# Patient Record
Sex: Female | Born: 1965
Health system: Southern US, Community
[De-identification: ages and names within clinical notes are randomized; demographics above are authoritative.]

## PROBLEM LIST (undated history)

## (undated) DIAGNOSIS — F419 Anxiety disorder, unspecified: Secondary | ICD-10-CM

## (undated) DIAGNOSIS — F32A Depression, unspecified: Secondary | ICD-10-CM

## (undated) DIAGNOSIS — I10 Essential (primary) hypertension: Secondary | ICD-10-CM

## (undated) HISTORY — PX: HERNIA REPAIR: SHX51

## (undated) HISTORY — PX: APPENDECTOMY: SHX54

## (undated) HISTORY — PX: ABDOMINAL HYSTERECTOMY: SHX81

---

## 2001-09-30 ENCOUNTER — Other Ambulatory Visit: Admission: RE | Admit: 2001-09-30 | Discharge: 2001-09-30 | Payer: Self-pay | Admitting: Family Medicine

## 2003-05-01 ENCOUNTER — Other Ambulatory Visit: Admission: RE | Admit: 2003-05-01 | Discharge: 2003-05-01 | Payer: Self-pay | Admitting: Obstetrics & Gynecology

## 2003-11-24 ENCOUNTER — Observation Stay (HOSPITAL_COMMUNITY): Admission: RE | Admit: 2003-11-24 | Discharge: 2003-11-25 | Payer: Self-pay | Admitting: Obstetrics & Gynecology

## 2010-02-06 ENCOUNTER — Encounter: Payer: Self-pay | Admitting: Family Medicine

## 2012-05-21 ENCOUNTER — Other Ambulatory Visit: Payer: Self-pay

## 2012-05-21 MED ORDER — ALPRAZOLAM 0.5 MG PO TABS: 0.5000 mg | ORAL_TABLET | Freq: Every evening | ORAL | Status: DC | PRN

## 2012-05-21 NOTE — Telephone Encounter (Signed)
Please call in rx for xanax 

## 2012-05-21 NOTE — Telephone Encounter (Signed)
Last seen 5/13  If approved call in and have nurse notify patient

## 2012-05-22 NOTE — Telephone Encounter (Signed)
[facility]

## 2012-05-23 ENCOUNTER — Other Ambulatory Visit: Payer: Self-pay | Admitting: Nurse Practitioner

## 2012-05-24 NOTE — Telephone Encounter (Signed)
No labs or visit since 1/13

## 2012-07-01 ENCOUNTER — Other Ambulatory Visit: Payer: Self-pay | Admitting: Nurse Practitioner

## 2012-07-04 NOTE — Telephone Encounter (Signed)
Rx called to vm.

## 2012-07-04 NOTE — Telephone Encounter (Signed)
Last filled 05/21/12  Not seen in EPIC  No upcoming appointment  If approved call in and have nurse inform patient

## 2012-07-04 NOTE — Telephone Encounter (Signed)
Please call in xanax rx with 1 refill 

## 2012-07-27 ENCOUNTER — Other Ambulatory Visit: Payer: Self-pay | Admitting: Nurse Practitioner

## 2012-07-30 NOTE — Telephone Encounter (Signed)
Not seen since 05/13

## 2012-08-21 ENCOUNTER — Encounter (HOSPITAL_COMMUNITY): Payer: Self-pay | Admitting: Emergency Medicine

## 2012-08-21 ENCOUNTER — Emergency Department (HOSPITAL_COMMUNITY)
Admission: EM | Admit: 2012-08-21 | Discharge: 2012-08-21 | Disposition: A | Discharge status: Home / Self Care | Payer: BC Managed Care – PPO | Source: Home / Self Care | Attending: Family Medicine | Admitting: Family Medicine

## 2012-08-21 DIAGNOSIS — M7522 Bicipital tendinitis, left shoulder: Secondary | ICD-10-CM

## 2012-08-21 DIAGNOSIS — M7520 Bicipital tendinitis, unspecified shoulder: Secondary | ICD-10-CM

## 2012-08-21 HISTORY — DX: Essential (primary) hypertension: I10

## 2012-08-21 HISTORY — DX: Anxiety disorder, unspecified: F41.9

## 2012-08-21 MED ORDER — METHYLPREDNISOLONE 4 MG PO KIT: PACK | ORAL | Status: DC

## 2012-08-21 MED ORDER — KETOROLAC TROMETHAMINE 30 MG/ML IJ SOLN
INTRAMUSCULAR | Status: AC
  Filled 2012-08-21: qty 1

## 2012-08-21 MED ORDER — METHYLPREDNISOLONE ACETATE 40 MG/ML IJ SUSP
80.0000 mg | Freq: Once | INTRAMUSCULAR | Status: AC
  Administered 2012-08-21: 80 mg via INTRAMUSCULAR

## 2012-08-21 MED ORDER — KETOROLAC TROMETHAMINE 10 MG PO TABS: 10.0000 mg | ORAL_TABLET | Freq: Four times a day (QID) | ORAL | Status: DC | PRN

## 2012-08-21 MED ORDER — KETOROLAC TROMETHAMINE 30 MG/ML IJ SOLN
30.0000 mg | Freq: Once | INTRAMUSCULAR | Status: AC
  Administered 2012-08-21: 30 mg via INTRAMUSCULAR

## 2012-08-21 MED ORDER — METHYLPREDNISOLONE ACETATE 80 MG/ML IJ SUSP
INTRAMUSCULAR | Status: AC
  Filled 2012-08-21: qty 1

## 2012-08-21 NOTE — ED Provider Notes (Signed)
  CSN: [id number]     Arrival date & time 08/21/12  1458 History     First MD Initiated Contact with Patient 08/21/12 1628     Chief Complaint  Patient presents with  . Arm Pain   (Consider location/radiation/quality/duration/timing/severity/associated sxs/prior Treatment) Patient is a 47 y.o. female presenting with arm pain. The history is provided by the patient.  Arm Pain This is a new problem. The current episode started more than 1 week ago. The problem has been gradually worsening. Pertinent negatives include no chest pain and no abdominal pain.    Past Medical History  Diagnosis Date  . Anxiety   . Hypertension    Past Surgical History  Procedure Laterality Date  . Abdominal hysterectomy     No family history on file. History  Substance Use Topics  . Smoking status: Never Smoker   . Smokeless tobacco: Not on file  . Alcohol Use: No   OB History   Grav Para Term Preterm Abortions TAB SAB Ect Mult Living                 Review of Systems  Constitutional: Negative.   Cardiovascular: Negative for chest pain.  Gastrointestinal: Negative for abdominal pain.  Musculoskeletal: Positive for myalgias. Negative for joint swelling and gait problem.  Neurological: Negative for weakness and numbness.    Allergies  Review of patient's allergies indicates no known allergies.  Home Medications   Current Outpatient Rx  Name  Route  Sig  Dispense  Refill  . lisinopril (PRINIVIL,ZESTRIL) 10 MG tablet      TAKE 1 TABLET BY MOUTH EVERY DAY   30 tablet   0   . sertraline (ZOLOFT) 50 MG tablet      TAKE ONE TABLET BY MOUTH DAILY   30 tablet   4     NTBS   . ALPRAZolam (XANAX) 0.5 MG tablet      TAKE ONE TABLET BY MOUTH TWICE A DAY   60 tablet   1   . ketorolac (TORADOL) 10 MG tablet   Oral   Take 1 tablet (10 mg total) by mouth every 6 (six) hours as needed for pain.   20 tablet   0   . methylPREDNISolone (MEDROL DOSEPAK) 4 MG tablet      follow package  directions, start on thurs, take until finished.   21 tablet   0    BP 173/98  Pulse 99  Temp(Src) 98.3 F (36.8 C) (Oral)  Resp 18  SpO2 99% Physical Exam  Nursing note and vitals reviewed. Constitutional: She is oriented to person, place, and time. She appears well-developed and well-nourished.  Musculoskeletal: She exhibits tenderness.  Ecchymosis to left biceps area and diffuse trapezius and upper and lower arm soreness to palp and rom, nl sensory and vasc fxn.  Neurological: She is alert and oriented to person, place, and time.  Skin: Skin is warm and dry.    ED Course   Procedures (including critical care time)  Labs Reviewed - No data to display No results found. 1. Tendonitis, bicipital, left     MDM    Linna Hoff, MD 08/21/12 1651

## 2012-08-21 NOTE — ED Notes (Signed)
Pt c/o left arm pain onset 2 weeks... Has been taking advil thinking it would alleviate it but pain is gradually getting worse... Denies inj/trauma, strenuous activity, CP, SOB... Pain is constant and radiates from biceps down to hand... Pt is alert but very anxious; hx of anxiety... BP is 173/98; did not take BP meds today.

## 2012-08-28 ENCOUNTER — Ambulatory Visit: Payer: Self-pay | Admitting: Family Medicine

## 2012-09-29 ENCOUNTER — Other Ambulatory Visit: Payer: Self-pay | Admitting: Nurse Practitioner

## 2012-09-30 NOTE — Telephone Encounter (Signed)
Last seen 06/07/11  MMM

## 2012-10-14 ENCOUNTER — Other Ambulatory Visit: Payer: Self-pay | Admitting: Nurse Practitioner

## 2012-10-17 ENCOUNTER — Other Ambulatory Visit: Payer: Self-pay | Admitting: Nurse Practitioner

## 2012-10-22 ENCOUNTER — Other Ambulatory Visit: Payer: Self-pay | Admitting: Nurse Practitioner

## 2012-11-15 ENCOUNTER — Encounter: Payer: Self-pay | Admitting: Family Medicine

## 2012-11-15 ENCOUNTER — Ambulatory Visit: Payer: Self-pay | Admitting: Family Medicine

## 2012-11-15 ENCOUNTER — Encounter (INDEPENDENT_AMBULATORY_CARE_PROVIDER_SITE_OTHER): Payer: Self-pay

## 2012-11-15 ENCOUNTER — Ambulatory Visit (INDEPENDENT_AMBULATORY_CARE_PROVIDER_SITE_OTHER): Payer: BC Managed Care – PPO | Admitting: Family Medicine

## 2012-11-15 VITALS — BP 159/100 | HR 101 | Temp 99.3°F | Wt 191.0 lb

## 2012-11-15 DIAGNOSIS — Z5189 Encounter for other specified aftercare: Secondary | ICD-10-CM

## 2012-11-15 DIAGNOSIS — F329 Major depressive disorder, single episode, unspecified: Secondary | ICD-10-CM

## 2012-11-15 DIAGNOSIS — T782XXD Anaphylactic shock, unspecified, subsequent encounter: Secondary | ICD-10-CM

## 2012-11-15 DIAGNOSIS — E785 Hyperlipidemia, unspecified: Secondary | ICD-10-CM

## 2012-11-15 DIAGNOSIS — F32A Depression, unspecified: Secondary | ICD-10-CM

## 2012-11-15 DIAGNOSIS — I10 Essential (primary) hypertension: Secondary | ICD-10-CM

## 2012-11-15 DIAGNOSIS — R7989 Other specified abnormal findings of blood chemistry: Secondary | ICD-10-CM

## 2012-11-15 MED ORDER — EPINEPHRINE 0.3 MG/0.3ML IJ SOAJ: 0.3000 mg | Freq: Once | INTRAMUSCULAR | Status: DC

## 2012-11-15 MED ORDER — LISINOPRIL 10 MG PO TABS: 10.0000 mg | ORAL_TABLET | Freq: Every day | ORAL | Status: DC

## 2012-11-15 MED ORDER — ALPRAZOLAM 0.5 MG PO TABS: 0.5000 mg | ORAL_TABLET | Freq: Three times a day (TID) | ORAL | Status: DC | PRN

## 2012-11-15 MED ORDER — SERTRALINE HCL 50 MG PO TABS: 50.0000 mg | ORAL_TABLET | Freq: Every day | ORAL | Status: DC

## 2012-11-15 NOTE — Patient Instructions (Signed)

## 2012-11-15 NOTE — Progress Notes (Signed)
  Subjective:    Patient ID: Gabriela Mccormick, female    DOB: 1965-03-07, 47 y.o.   MRN: [id number]  HPI This 47 y.o. female presents for evaluation of follow up.  She has hx of depression Back pain, and hypertension.  She is out of her medicine and needs refills.  She states She has been diagnosed with bulging disks and she has had a cervical and lumbar  Epidural injections.  She has recent labs from her work.  She has a wellness program And has had a cbc, lipid, and cmp drawn.  Her trigs are elevated at 281 and  GGT - 140. She does admit to drinking ETOH the night prior to having blood drawn.  Her AST and ALT are Normal. Review of Systems    No chest pain, SOB, HA, dizziness, vision change, N/V, diarrhea, constipation, dysuria, urinary urgency or frequency, myalgias, arthralgias or rash.  Objective:   Physical Exam Vital signs noted  Well developed well nourished female.  HEENT - Head atraumatic Normocephalic                Eyes - PERRLA, Conjuctiva - clear Sclera- Clear EOMI                Ears - EAC's Wnl TM's Wnl Gross Hearing WNL                Nose - Nares patent                 Throat - oropharanx wnl Respiratory - Lungs CTA bilateral Cardiac - RRR S1 and S2 without murmur GI - Abdomen soft Nontender and bowel sounds active x 4 Extremities - No edema. Neuro - Grossly intact.       Assessment & Plan:  Essential hypertension, benign - Plan: lisinopril (PRINIVIL,ZESTRIL) 10 MG tablet  Depression - Plan: ALPRAZolam (XANAX) 0.5 MG tablet, sertraline (ZOLOFT) 50 MG tablet  Anaphylaxis, subsequent encounter - Plan: EPINEPHrine (EPIPEN) 0.3 mg/0.3 mL SOAJ injection  Elevated LFTs - Do not drink ETOH and repeat LFT.  Other and unspecified hyperlipidemia - Fish oil tablets otc  Follow up in 4 months.  Deatra Canter FNP

## 2013-03-17 ENCOUNTER — Other Ambulatory Visit: Payer: Self-pay | Admitting: Family Medicine

## 2013-03-19 NOTE — Telephone Encounter (Signed)
Last seen 11/15/12  [facility]  If approved route to nurse to [facility]  [phone]

## 2013-04-11 ENCOUNTER — Encounter: Payer: Self-pay | Admitting: Family Medicine

## 2013-04-11 ENCOUNTER — Ambulatory Visit (INDEPENDENT_AMBULATORY_CARE_PROVIDER_SITE_OTHER): Payer: BC Managed Care – PPO | Admitting: Family Medicine

## 2013-04-11 VITALS — BP 140/80 | HR 85 | Temp 98.5°F | Ht 65.0 in | Wt 191.2 lb

## 2013-04-11 DIAGNOSIS — I10 Essential (primary) hypertension: Secondary | ICD-10-CM

## 2013-04-11 DIAGNOSIS — F32A Depression, unspecified: Secondary | ICD-10-CM

## 2013-04-11 DIAGNOSIS — F3289 Other specified depressive episodes: Secondary | ICD-10-CM

## 2013-04-11 DIAGNOSIS — F329 Major depressive disorder, single episode, unspecified: Secondary | ICD-10-CM

## 2013-04-11 MED ORDER — ALPRAZOLAM 0.5 MG PO TABS: 0.5000 mg | ORAL_TABLET | Freq: Three times a day (TID) | ORAL | Status: DC | PRN

## 2013-04-11 MED ORDER — LISINOPRIL 10 MG PO TABS: 10.0000 mg | ORAL_TABLET | Freq: Every day | ORAL | Status: DC

## 2013-04-11 MED ORDER — SERTRALINE HCL 100 MG PO TABS: 100.0000 mg | ORAL_TABLET | Freq: Every day | ORAL | Status: DC

## 2013-04-11 NOTE — Progress Notes (Signed)
   Subjective:    Patient ID: Gabriela Mccormick, female    DOB: May 15, 1965, 48 y.o.   MRN: [id number]  HPI  This 48 y.o. female presents for evaluation of depression and anxiety.  She is having increased anxiety And stress levels.  She needs refills on her sertraline, xanax, and lisinopril.  Review of Systems    No chest pain, SOB, HA, dizziness, vision change, N/V, diarrhea, constipation, dysuria, urinary urgency or frequency, myalgias, arthralgias or rash.  Objective:   Physical Exam  Vital signs noted  Well developed well nourished female.  HEENT - Head atraumatic Normocephalic                Eyes - PERRLA, Conjuctiva - clear Sclera- Clear EOMI                Ears - EAC's Wnl TM's Wnl Gross Hearing WNL                Throat - oropharanx wnl Respiratory - Lungs CTA bilateral Cardiac - RRR S1 and S2 without murmur GI - Abdomen soft Nontender and bowel sounds active x 4 Extremities - No edema. Neuro - Grossly intact.      Assessment & Plan:  Depression - Plan: ALPRAZolam (XANAX) 0.5 MG tablet, sertraline (ZOLOFT) 100 MG tablet Increased zoloft to 100mg  po qd and explained she needs to be seen every 3 months for her xanax to be refilled and have given controlled substance agreement  Essential hypertension, benign - Plan: lisinopril (PRINIVIL,ZESTRIL) 10 MG tablet  Lysbeth Penner FNP

## 2013-09-26 ENCOUNTER — Encounter: Payer: Self-pay | Admitting: Nurse Practitioner

## 2013-09-26 ENCOUNTER — Ambulatory Visit (INDEPENDENT_AMBULATORY_CARE_PROVIDER_SITE_OTHER): Payer: BC Managed Care – PPO | Admitting: Nurse Practitioner

## 2013-09-26 VITALS — BP 120/74 | HR 91 | Temp 99.7°F | Ht 65.0 in | Wt 194.4 lb

## 2013-09-26 DIAGNOSIS — F329 Major depressive disorder, single episode, unspecified: Secondary | ICD-10-CM

## 2013-09-26 DIAGNOSIS — I10 Essential (primary) hypertension: Secondary | ICD-10-CM

## 2013-09-26 DIAGNOSIS — F3289 Other specified depressive episodes: Secondary | ICD-10-CM

## 2013-09-26 DIAGNOSIS — F32A Depression, unspecified: Secondary | ICD-10-CM

## 2013-09-26 MED ORDER — SERTRALINE HCL 100 MG PO TABS: 100.0000 mg | ORAL_TABLET | Freq: Every day | ORAL | Status: DC

## 2013-09-26 MED ORDER — ALPRAZOLAM 0.5 MG PO TABS: 0.5000 mg | ORAL_TABLET | Freq: Three times a day (TID) | ORAL | Status: DC | PRN

## 2013-09-26 MED ORDER — LISINOPRIL 10 MG PO TABS: 10.0000 mg | ORAL_TABLET | Freq: Every day | ORAL | Status: DC

## 2013-09-26 NOTE — Progress Notes (Signed)
   Subjective:    Patient ID: Gabriela Mccormick, female    DOB: 02/11/1965, 48 y.o.   MRN: [id number]  Patient is here for chronic disease follow up. No acute complaint.   Hypertension This is a chronic problem. The current episode started more than 1 year ago. The problem is controlled. Associated symptoms include anxiety. Risk factors for coronary artery disease include stress. Past treatments include ACE inhibitors. The current treatment provides moderate improvement. There are no compliance problems.     DEPRESSION: Patient is taking zoloft and reports doing well.   Review of Systems  HENT: Negative.   Eyes: Negative.   Respiratory: Negative.   Cardiovascular: Negative.   Gastrointestinal: Negative.   Endocrine: Negative.   Genitourinary: Negative.   Musculoskeletal: Negative.   Skin: Negative.   Allergic/Immunologic: Negative.   Neurological: Negative.   Hematological: Negative.   Psychiatric/Behavioral: Negative.        Objective:   Physical Exam  Constitutional: She is oriented to person, place, and time. She appears well-developed and well-nourished.  Eyes: Pupils are equal, round, and reactive to light.  Neck: Normal range of motion.  Cardiovascular: Normal rate.   Abdominal: Soft.  Neurological: She is alert and oriented to person, place, and time.  Skin: Skin is warm.     BP 120/74  Pulse 91  Temp(Src) 99.7 F (37.6 C) (Oral)  Ht 5\' 5"  (1.651 m)  Wt 194 lb 6.4 oz (88.179 kg)  BMI 32.35 kg/m2      Assessment & Plan:   1. Essential hypertension, benign   2. Depression   3. Essential hypertension    No orders of the defined types were placed in this encounter.   Meds ordered this encounter  Medications  . sertraline (ZOLOFT) 100 MG tablet    Sig: Take 1 tablet (100 mg total) by mouth daily.    Dispense:  30 tablet    Refill:  5    Order Specific Question:  Supervising Provider    Answer:  Chipper Herb [[id number]]  . ALPRAZolam (XANAX) 0.5 MG  tablet    Sig: Take 1 tablet (0.5 mg total) by mouth 3 (three) times daily as needed for sleep.    Dispense:  60 tablet    Refill:  1    Order Specific Question:  Supervising Provider    Answer:  Chipper Herb [[id number]]  . lisinopril (PRINIVIL,ZESTRIL) 10 MG tablet    Sig: Take 1 tablet (10 mg total) by mouth daily.    Dispense:  30 tablet    Refill:  5    Order Specific Question:  Supervising Provider    Answer:  Chipper Herb [[id number]]    Discussed weight management for patient with BMI> 25 Patient had lab done at work, she will send a copy to the office.  Health maintenance reviewed Diet and exercise encouraged Continue all meds Follow up  In 3-6 months  PRN  [name], FNP

## 2013-09-26 NOTE — Patient Instructions (Signed)

## 2013-10-27 ENCOUNTER — Encounter: Payer: Self-pay | Admitting: Gastroenterology

## 2013-12-19 ENCOUNTER — Other Ambulatory Visit: Payer: Self-pay | Admitting: Nurse Practitioner

## 2013-12-20 NOTE — Telephone Encounter (Signed)
Please call in xanax with 0 refills 

## 2013-12-20 NOTE — Telephone Encounter (Signed)
Pt last seen 9/11 given rx for xanax 0.5 #60 with 1 refill TID prn, wants refill. If approved [facility].

## 2013-12-23 ENCOUNTER — Telehealth: Payer: Self-pay | Admitting: *Deleted

## 2013-12-23 NOTE — Telephone Encounter (Signed)
Script for xanax called [facility] [place].

## 2013-12-25 ENCOUNTER — Telehealth: Payer: Self-pay | Admitting: Gastroenterology

## 2013-12-25 ENCOUNTER — Ambulatory Visit: Payer: BC Managed Care – PPO | Admitting: Gastroenterology

## 2013-12-25 NOTE — Telephone Encounter (Signed)
No charge. 

## 2013-12-25 NOTE — Telephone Encounter (Signed)
[facility], will close call.

## 2014-01-29 ENCOUNTER — Other Ambulatory Visit: Payer: Self-pay | Admitting: Nurse Practitioner

## 2014-01-30 NOTE — Telephone Encounter (Signed)
rx called into [facility] pharmacy, pt aware.

## 2014-01-30 NOTE — Telephone Encounter (Signed)
Please call in xanax with 1 refills 

## 2014-01-30 NOTE — Telephone Encounter (Signed)
Last seen 09/26/13, last filled 12/23/13. Call into [facility] [phone]

## 2014-03-10 ENCOUNTER — Other Ambulatory Visit: Payer: Self-pay | Admitting: Nurse Practitioner

## 2014-03-11 NOTE — Telephone Encounter (Signed)
Please advise on refill, last seen 09/27/14, no follow up scheduled.

## 2014-03-11 NOTE — Telephone Encounter (Signed)
Please call in xanax with 1 refills 

## 2014-03-11 NOTE — Telephone Encounter (Signed)
[facility] Pt notified

## 2014-04-29 ENCOUNTER — Other Ambulatory Visit: Payer: Self-pay | Admitting: Nurse Practitioner

## 2014-04-30 NOTE — Telephone Encounter (Signed)
Last seen 09/26/13 MMM  If approved route to nurse [facility]  [phone]

## 2014-04-30 NOTE — Telephone Encounter (Signed)
Please call in xanax with 1 refills 

## 2014-05-01 ENCOUNTER — Telehealth: Payer: Self-pay | Admitting: *Deleted

## 2014-05-01 NOTE — Telephone Encounter (Signed)
Script for xanax called in to [facility].

## 2014-07-07 ENCOUNTER — Other Ambulatory Visit: Payer: Self-pay | Admitting: Family

## 2014-07-14 ENCOUNTER — Other Ambulatory Visit: Payer: Self-pay | Admitting: Nurse Practitioner

## 2014-07-15 ENCOUNTER — Other Ambulatory Visit: Payer: Self-pay | Admitting: Nurse Practitioner

## 2014-07-15 NOTE — Telephone Encounter (Addendum)
no more refills without being seen Please call in xanax with 0 refills 

## 2014-07-15 NOTE — Telephone Encounter (Signed)
Please review and advise.

## 2014-07-15 NOTE — Telephone Encounter (Signed)
rx called into pharmacy

## 2014-08-21 ENCOUNTER — Other Ambulatory Visit: Payer: Self-pay | Admitting: Nurse Practitioner

## 2014-08-21 MED ORDER — ALPRAZOLAM 0.5 MG PO TABS: 0.5000 mg | ORAL_TABLET | Freq: Three times a day (TID) | ORAL | Status: DC | PRN

## 2014-08-21 NOTE — Telephone Encounter (Signed)
Please call in xanax 0.5 1 po TID #90 with 0 refills

## 2014-08-21 NOTE — Telephone Encounter (Signed)
Perscription was called into pharmacy and pt notified VM.

## 2014-09-03 ENCOUNTER — Ambulatory Visit: Payer: Self-pay | Admitting: Nurse Practitioner

## 2014-09-04 ENCOUNTER — Encounter: Payer: Self-pay | Admitting: Nurse Practitioner

## 2014-09-04 ENCOUNTER — Ambulatory Visit (INDEPENDENT_AMBULATORY_CARE_PROVIDER_SITE_OTHER): Payer: BLUE CROSS/BLUE SHIELD | Admitting: Nurse Practitioner

## 2014-09-04 VITALS — BP 130/86 | HR 90 | Temp 98.1°F | Ht 65.0 in | Wt 201.0 lb

## 2014-09-04 DIAGNOSIS — K219 Gastro-esophageal reflux disease without esophagitis: Secondary | ICD-10-CM

## 2014-09-04 DIAGNOSIS — F32A Depression, unspecified: Secondary | ICD-10-CM

## 2014-09-04 DIAGNOSIS — I10 Essential (primary) hypertension: Secondary | ICD-10-CM

## 2014-09-04 DIAGNOSIS — Z6833 Body mass index (BMI) 33.0-33.9, adult: Secondary | ICD-10-CM | POA: Diagnosis not present

## 2014-09-04 DIAGNOSIS — F329 Major depressive disorder, single episode, unspecified: Secondary | ICD-10-CM | POA: Diagnosis not present

## 2014-09-04 DIAGNOSIS — Z6830 Body mass index (BMI) 30.0-30.9, adult: Secondary | ICD-10-CM | POA: Insufficient documentation

## 2014-09-04 DIAGNOSIS — Z0000 Encounter for general adult medical examination without abnormal findings: Secondary | ICD-10-CM | POA: Diagnosis not present

## 2014-09-04 MED ORDER — SERTRALINE HCL 100 MG PO TABS: ORAL_TABLET | ORAL | Status: DC

## 2014-09-04 MED ORDER — LANSOPRAZOLE 30 MG PO CPDR: 30.0000 mg | DELAYED_RELEASE_CAPSULE | Freq: Two times a day (BID) | ORAL | Status: DC

## 2014-09-04 MED ORDER — LISINOPRIL 10 MG PO TABS: 10.0000 mg | ORAL_TABLET | Freq: Every day | ORAL | Status: DC

## 2014-09-04 NOTE — Progress Notes (Signed)
Subjective:    Patient ID: Gabriela Mccormick, female    DOB: December 21, 1965, 49 y.o.   MRN: [id number]  Patient is here for physical exam without pap as well as chronic disease follow up. No acute complaint.   Hypertension This is a chronic problem. The current episode started more than 1 year ago. The problem is unchanged. The problem is controlled. Pertinent negatives include no chest pain, headaches, palpitations or peripheral edema. Risk factors for coronary artery disease include obesity. Past treatments include ACE inhibitors. The current treatment provides moderate improvement. Compliance problems include diet and exercise.  There is no history of CAD/MI.  DEPRESSION Patient is taking zoloft and reports doing well. Uses xanax on a prn basis GERD Currently on prevacid 2x a day- getting better- symptoms are much better.    Review of Systems  HENT: Negative.   Eyes: Negative.   Respiratory: Negative.   Cardiovascular: Negative for chest pain and palpitations.  Gastrointestinal: Negative.   Endocrine: Negative.   Genitourinary: Negative.   Musculoskeletal: Negative.   Skin: Negative.   Allergic/Immunologic: Negative.   Neurological: Negative.  Negative for headaches.  Hematological: Negative.   Psychiatric/Behavioral: Negative.        Objective:   Physical Exam  Constitutional: She is oriented to person, place, and time. She appears well-developed and well-nourished.  HENT:  Head: Normocephalic.  Right Ear: Hearing, tympanic membrane, external ear and ear canal normal.  Left Ear: Hearing, tympanic membrane, external ear and ear canal normal.  Nose: Nose normal.  Mouth/Throat: Uvula is midline, oropharynx is clear and moist and mucous membranes are normal.  Eyes: Conjunctivae and EOM are normal. Pupils are equal, round, and reactive to light.  Neck: Normal range of motion. Neck supple. No JVD present. No thyromegaly present.  Cardiovascular: Normal rate, normal heart sounds  and intact distal pulses.   No murmur heard. Pulmonary/Chest: Effort normal and breath sounds normal. She has no wheezes. She has no rales.  Abdominal: Soft. Bowel sounds are normal. She exhibits no mass.  Musculoskeletal: Normal range of motion.  Neurological: She is alert and oriented to person, place, and time. She has normal reflexes.  Skin: Skin is warm and dry.  Psychiatric: She has a normal mood and affect. Her behavior is normal. Judgment and thought content normal.   BP 130/86 mmHg  Pulse 90  Temp(Src) 98.1 F (36.7 C) (Oral)  Ht $R'5\' 5"'zN$  (1.651 m)  Wt 201 lb (91.173 kg)  BMI 33.45 kg/m2         Assessment & Plan:   1. Depression Stress management - sertraline (ZOLOFT) 100 MG tablet; TAKE 1 TABLET (100 MG TOTAL) BY MOUTH DAILY.  Dispense: 30 tablet; Refill: 5  2. BMI 33.0-33.9,adult Discussed diet and exercise for person with BMI >25 Will recheck weight in 3-6 months  3. Essential hypertension, benign Do not add salt to diet - lisinopril (PRINIVIL,ZESTRIL) 10 MG tablet; Take 1 tablet (10 mg total) by mouth daily.  Dispense: 30 tablet; Refill: 5 - CMP14+EGFR - Lipid panel  4. Gastroesophageal reflux disease without esophagitis Avoid spicy foods Do not eat 2 hours prior to bedtime - lansoprazole (PREVACID) 30 MG capsule; Take 1 capsule (30 mg total) by mouth 2 (two) times daily.  Dispense: 60 capsule; Refill: 5  5. Annual physical exam - CBC with Differential/Platelet - Thyroid Panel With TSH    Labs pending Health maintenance reviewed Diet and exercise encouraged Continue all meds Follow up  In 6 months   Mary-Margaret  Hassell Done, [place]

## 2014-09-05 LAB — CMP14+EGFR
ALK PHOS: 75 IU/L (ref 39–117)
ALT: 32 IU/L (ref 0–32)
AST: 36 IU/L (ref 0–40)
Albumin/Globulin Ratio: 1.6 (ref 1.1–2.5)
Albumin: 4.4 g/dL (ref 3.5–5.5)
BILIRUBIN TOTAL: 0.2 mg/dL (ref 0.0–1.2)
BUN / CREAT RATIO: 17 (ref 9–23)
BUN: 11 mg/dL (ref 6–24)
CHLORIDE: 99 mmol/L (ref 97–108)
CO2: 25 mmol/L (ref 18–29)
Calcium: 10.1 mg/dL (ref 8.7–10.2)
Creatinine, Ser: 0.63 mg/dL (ref 0.57–1.00)
GFR calc Af Amer: 122 mL/min/{1.73_m2} (ref >59)
GFR calc non Af Amer: 106 mL/min/{1.73_m2} (ref >59)
GLUCOSE: 88 mg/dL (ref 65–99)
Globulin, Total: 2.7 g/dL (ref 1.5–4.5)
Potassium: 5 mmol/L (ref 3.5–5.2)
Sodium: 139 mmol/L (ref 134–144)
Total Protein: 7.1 g/dL (ref 6.0–8.5)

## 2014-09-05 LAB — THYROID PANEL WITH TSH
Free Thyroxine Index: 1.7 (ref 1.2–4.9)
T3 Uptake Ratio: 22 % — ABNORMAL LOW (ref 24–39)
T4 TOTAL: 7.6 ug/dL (ref 4.5–12.0)
TSH: 2.11 u[IU]/mL (ref 0.450–4.500)

## 2014-09-05 LAB — LIPID PANEL
CHOLESTEROL TOTAL: 230 mg/dL — AB (ref 100–199)
Chol/HDL Ratio: 4.3 ratio units (ref 0.0–4.4)
HDL: 54 mg/dL (ref >39)
LDL Calculated: 99 mg/dL (ref 0–99)
Triglycerides: 386 mg/dL — ABNORMAL HIGH (ref 0–149)
VLDL CHOLESTEROL CAL: 77 mg/dL — AB (ref 5–40)

## 2014-09-07 ENCOUNTER — Other Ambulatory Visit: Payer: Self-pay | Admitting: Nurse Practitioner

## 2014-09-07 MED ORDER — FENOFIBRATE 160 MG PO TABS: 160.0000 mg | ORAL_TABLET | Freq: Every day | ORAL | Status: DC

## 2014-10-14 ENCOUNTER — Other Ambulatory Visit: Payer: Self-pay | Admitting: Nurse Practitioner

## 2014-10-15 NOTE — Telephone Encounter (Signed)
Last seen 09/04/14  MMM If approved route to nurse [facility]  [phone]

## 2014-10-15 NOTE — Telephone Encounter (Signed)
OK to call in #90 tabs, no refills

## 2014-10-16 ENCOUNTER — Other Ambulatory Visit: Payer: Self-pay | Admitting: Nurse Practitioner

## 2014-10-16 NOTE — Telephone Encounter (Signed)
[facility]ill [facility].

## 2014-11-19 ENCOUNTER — Other Ambulatory Visit: Payer: Self-pay | Admitting: Pediatrics

## 2014-11-19 NOTE — Telephone Encounter (Signed)
Last filled 10/16/14, last seen 09/04/14. Route. Call in at [place] [phone]

## 2014-11-20 NOTE — Telephone Encounter (Signed)
Please call in xanax with 1 refills 

## 2014-11-25 NOTE — Telephone Encounter (Signed)
rx called into pharmacy

## 2014-12-21 ENCOUNTER — Telehealth: Payer: Self-pay | Admitting: Nurse Practitioner

## 2014-12-22 NOTE — Telephone Encounter (Signed)
She does not do flu shots

## 2015-02-23 ENCOUNTER — Other Ambulatory Visit: Payer: Self-pay | Admitting: Nurse Practitioner

## 2015-02-23 NOTE — Telephone Encounter (Signed)
Last seen 09/04/14  MMM If approved route to nurse [facility]  [phone] 

## 2015-02-23 NOTE — Telephone Encounter (Signed)
Please call in xanax with 1 refills 

## 2015-02-23 NOTE — Telephone Encounter (Signed)
rx called into pharmacy

## 2015-03-03 ENCOUNTER — Other Ambulatory Visit: Payer: Self-pay

## 2015-03-03 DIAGNOSIS — I10 Essential (primary) hypertension: Secondary | ICD-10-CM

## 2015-03-03 MED ORDER — LISINOPRIL 10 MG PO TABS: 10.0000 mg | ORAL_TABLET | Freq: Every day | ORAL | Status: DC

## 2015-03-03 MED ORDER — FENOFIBRATE 160 MG PO TABS: 160.0000 mg | ORAL_TABLET | Freq: Every day | ORAL | Status: DC

## 2015-03-03 NOTE — Telephone Encounter (Signed)
Last seen and last lipid 09/04/14  MMM  Requesting 90 day supply

## 2015-03-03 NOTE — Telephone Encounter (Signed)
Pt scheduled appt 3/10 at 9:00 with MMM.

## 2015-03-03 NOTE — Telephone Encounter (Signed)
Last refill without being seen 

## 2015-03-26 ENCOUNTER — Encounter: Payer: Self-pay | Admitting: Nurse Practitioner

## 2015-03-26 ENCOUNTER — Ambulatory Visit (INDEPENDENT_AMBULATORY_CARE_PROVIDER_SITE_OTHER): Payer: BLUE CROSS/BLUE SHIELD | Admitting: Nurse Practitioner

## 2015-03-26 VITALS — BP 133/91 | HR 79 | Temp 98.2°F | Ht 65.0 in | Wt 208.0 lb

## 2015-03-26 DIAGNOSIS — Z6833 Body mass index (BMI) 33.0-33.9, adult: Secondary | ICD-10-CM | POA: Diagnosis not present

## 2015-03-26 DIAGNOSIS — K219 Gastro-esophageal reflux disease without esophagitis: Secondary | ICD-10-CM | POA: Diagnosis not present

## 2015-03-26 DIAGNOSIS — F329 Major depressive disorder, single episode, unspecified: Secondary | ICD-10-CM

## 2015-03-26 DIAGNOSIS — I10 Essential (primary) hypertension: Secondary | ICD-10-CM

## 2015-03-26 DIAGNOSIS — F32A Depression, unspecified: Secondary | ICD-10-CM

## 2015-03-26 DIAGNOSIS — E785 Hyperlipidemia, unspecified: Secondary | ICD-10-CM | POA: Diagnosis not present

## 2015-03-26 MED ORDER — LANSOPRAZOLE 30 MG PO CPDR: 30.0000 mg | DELAYED_RELEASE_CAPSULE | Freq: Two times a day (BID) | ORAL | Status: DC

## 2015-03-26 MED ORDER — NALTREXONE-BUPROPION HCL ER 8-90 MG PO TB12: ORAL_TABLET | ORAL | Status: DC

## 2015-03-26 MED ORDER — SERTRALINE HCL 100 MG PO TABS: ORAL_TABLET | ORAL | Status: DC

## 2015-03-26 MED ORDER — FENOFIBRATE 160 MG PO TABS: 160.0000 mg | ORAL_TABLET | Freq: Every day | ORAL | Status: DC

## 2015-03-26 MED ORDER — LISINOPRIL 10 MG PO TABS: 10.0000 mg | ORAL_TABLET | Freq: Every day | ORAL | Status: DC

## 2015-03-26 NOTE — Progress Notes (Signed)
Subjective:    Patient ID: Gabriela Mccormick, female    DOB: Oct 18, 1965, 50 y.o.   MRN: [id number]  Patient here today for follow up of chronic medical problems.  Outpatient Encounter Prescriptions as of 03/26/2015  Medication Sig  . ALPRAZolam (XANAX) 0.5 MG tablet TAKE ONE TABLET BY MOUTH 3 TIMES A DAY AS NEEDED  . EPINEPHrine (EPIPEN) 0.3 mg/0.3 mL SOAJ injection Inject 0.3 mLs (0.3 mg total) into the muscle once.  . fenofibrate 160 MG tablet Take 1 tablet (160 mg total) by mouth daily.  . lansoprazole (PREVACID) 30 MG capsule Take 1 capsule (30 mg total) by mouth 2 (two) times daily.  Marland Kitchen lisinopril (PRINIVIL,ZESTRIL) 10 MG tablet Take 1 tablet (10 mg total) by mouth daily.  . sertraline (ZOLOFT) 100 MG tablet TAKE 1 TABLET (100 MG TOTAL) BY MOUTH DAILY.   No facility-administered encounter medications on file as of 03/26/2015.   * would like to try weight loss pill- she has started walking and going to gym 2-3 days a week.  Hypertension This is a chronic problem. The current episode started more than 1 year ago. The problem is unchanged. The problem is controlled. Pertinent negatives include no chest pain, headaches, palpitations or peripheral edema. Risk factors for coronary artery disease include obesity. Past treatments include ACE inhibitors. The current treatment provides moderate improvement. Compliance problems include diet and exercise.  There is no history of CAD/MI.  Hyperlipidemia This is a chronic problem. The current episode started more than 1 year ago. The problem is controlled. Recent lipid tests were reviewed and are variable. There are no known factors aggravating her hyperlipidemia. Pertinent negatives include no chest pain. Current antihyperlipidemic treatment includes statins. The current treatment provides moderate improvement of lipids. Compliance problems include adherence to diet and adherence to exercise.  Risk factors for coronary artery disease include dyslipidemia  and hypertension.  DEPRESSION Patient is taking zoloft and reports doing well. Uses xanax on a prn basis GERD Currently on prevacid 2x a day- getting better- symptoms are much better.    Review of Systems  HENT: Negative.   Eyes: Negative.   Respiratory: Negative.   Cardiovascular: Negative for chest pain and palpitations.  Gastrointestinal: Negative.   Endocrine: Negative.   Genitourinary: Negative.   Musculoskeletal: Negative.   Skin: Negative.   Allergic/Immunologic: Negative.   Neurological: Negative.  Negative for headaches.  Hematological: Negative.   Psychiatric/Behavioral: Negative.        Objective:   Physical Exam  Constitutional: She is oriented to person, place, and time. She appears well-developed and well-nourished.  HENT:  Head: Normocephalic.  Right Ear: Hearing, tympanic membrane, external ear and ear canal normal.  Left Ear: Hearing, tympanic membrane, external ear and ear canal normal.  Nose: Nose normal.  Mouth/Throat: Uvula is midline, oropharynx is clear and moist and mucous membranes are normal.  Eyes: Conjunctivae and EOM are normal. Pupils are equal, round, and reactive to light.  Neck: Normal range of motion. Neck supple. No JVD present. No thyromegaly present.  Cardiovascular: Normal rate, normal heart sounds and intact distal pulses.   No murmur heard. Pulmonary/Chest: Effort normal and breath sounds normal. She has no wheezes. She has no rales.  Abdominal: Soft. Bowel sounds are normal. She exhibits no mass.  Musculoskeletal: Normal range of motion.  Neurological: She is alert and oriented to person, place, and time. She has normal reflexes.  Skin: Skin is warm and dry.  Psychiatric: She has a normal mood and affect. Her  behavior is normal. Judgment and thought content normal.   BP 133/91 mmHg  Pulse 79  Temp(Src) 98.2 F (36.8 C) (Oral)  Ht '5\' 5"'  (1.651 m)  Wt 208 lb (94.348 kg)  BMI 34.61 kg/m2       Assessment & Plan:   1.  Essential hypertension, benign Do not add salt to diet - lisinopril (PRINIVIL,ZESTRIL) 10 MG tablet; Take 1 tablet (10 mg total) by mouth daily.  Dispense: 90 tablet; Refill: 0 - CMP14+EGFR  2. Gastroesophageal reflux disease without esophagitis Avoid spicy foods Do not eat 2 hours prior to bedtime - lansoprazole (PREVACID) 30 MG capsule; Take 1 capsule (30 mg total) by mouth 2 (two) times daily.  Dispense: 60 capsule; Refill: 5  3. Depression Stress management - sertraline (ZOLOFT) 100 MG tablet; TAKE 1 TABLET (100 MG TOTAL) BY MOUTH DAILY.  Dispense: 30 tablet; Refill: 5  4. BMI 33.0-33.9,adult Discussed diet and exercise for person with BMI >25 Will recheck weight in 3-6 months - Naltrexone-Bupropion HCl ER (CONTRAVE) 8-90 MG TB12; 1 tab po Qd X7d, then 1 tab po BID X7d, Then 2tab in am and 1 tab Pm X7days , then 2 tab po BId  Dispense: 70 tablet; Refill: 4  5. Hyperlipidemia with target LDL less than 100 *low fat diet - fenofibrate 160 MG tablet; Take 1 tablet (160 mg total) by mouth daily.  Dispense: 90 tablet; Refill: 0 - Lipid panel    Labs pending Health maintenance reviewed Diet and exercise encouraged Continue all meds Follow up  In 6 month   [place], FNP

## 2015-03-26 NOTE — Patient Instructions (Signed)
Health Maintenance, Female Adopting a healthy lifestyle and getting preventive care can go a long way to promote health and wellness. Talk with your health care provider about what schedule of regular examinations is right for you. This is a good chance for you to check in with your provider about disease prevention and staying healthy. In between checkups, there are plenty of things you can do on your own. Experts have done a lot of research about which lifestyle changes and preventive measures are most likely to keep you healthy. Ask your health care provider for more information. WEIGHT AND DIET  Eat a healthy diet  Be sure to include plenty of vegetables, fruits, low-fat dairy products, and lean protein.  Do not eat a lot of foods high in solid fats, added sugars, or salt.  Get regular exercise. This is one of the most important things you can do for your health.  Most adults should exercise for at least 150 minutes each week. The exercise should increase your heart rate and make you sweat (moderate-intensity exercise).  Most adults should also do strengthening exercises at least twice a week. This is in addition to the moderate-intensity exercise.  Maintain a healthy weight  Body mass index (BMI) is a measurement that can be used to identify possible weight problems. It estimates body fat based on height and weight. Your health care provider can help determine your BMI and help you achieve or maintain a healthy weight.  For females 20 years of age and older:   A BMI below 18.5 is considered underweight.  A BMI of 18.5 to 24.9 is normal.  A BMI of 25 to 29.9 is considered overweight.  A BMI of 30 and above is considered obese.  Watch levels of cholesterol and blood lipids  You should start having your blood tested for lipids and cholesterol at 50 years of age, then have this test every 5 years.  You may need to have your cholesterol levels checked more often if:  Your lipid  or cholesterol levels are high.  You are older than 50 years of age.  You are at high risk for heart disease.  CANCER SCREENING   Lung Cancer  Lung cancer screening is recommended for adults 55-80 years old who are at high risk for lung cancer because of a history of smoking.  A yearly low-dose CT scan of the lungs is recommended for people who:  Currently smoke.  Have quit within the past 15 years.  Have at least a 30-pack-year history of smoking. A pack year is smoking an average of one pack of cigarettes a day for 1 year.  Yearly screening should continue until it has been 15 years since you quit.  Yearly screening should stop if you develop a health problem that would prevent you from having lung cancer treatment.  Breast Cancer  Practice breast self-awareness. This means understanding how your breasts normally appear and feel.  It also means doing regular breast self-exams. Let your health care provider know about any changes, no matter how small.  If you are in your 20s or 30s, you should have a clinical breast exam (CBE) by a health care provider every 1-3 years as part of a regular health exam.  If you are 40 or older, have a CBE every year. Also consider having a breast X-ray (mammogram) every year.  If you have a family history of breast cancer, talk to your health care provider about genetic screening.  If you   are at high risk for breast cancer, talk to your health care provider about having an MRI and a mammogram every year.  Breast cancer gene (BRCA) assessment is recommended for women who have family members with BRCA-related cancers. BRCA-related cancers include:  Breast.  Ovarian.  Tubal.  Peritoneal cancers.  Results of the assessment will determine the need for genetic counseling and BRCA1 and BRCA2 testing. Cervical Cancer Your health care provider may recommend that you be screened regularly for cancer of the pelvic organs (ovaries, uterus, and  vagina). This screening involves a pelvic examination, including checking for microscopic changes to the surface of your cervix (Pap test). You may be encouraged to have this screening done every 3 years, beginning at age 21.  For women ages 30-65, health care providers may recommend pelvic exams and Pap testing every 3 years, or they may recommend the Pap and pelvic exam, combined with testing for human papilloma virus (HPV), every 5 years. Some types of HPV increase your risk of cervical cancer. Testing for HPV may also be done on women of any age with unclear Pap test results.  Other health care providers may not recommend any screening for nonpregnant women who are considered low risk for pelvic cancer and who do not have symptoms. Ask your health care provider if a screening pelvic exam is right for you.  If you have had past treatment for cervical cancer or a condition that could lead to cancer, you need Pap tests and screening for cancer for at least 20 years after your treatment. If Pap tests have been discontinued, your risk factors (such as having a new sexual partner) need to be reassessed to determine if screening should resume. Some women have medical problems that increase the chance of getting cervical cancer. In these cases, your health care provider may recommend more frequent screening and Pap tests. Colorectal Cancer  This type of cancer can be detected and often prevented.  Routine colorectal cancer screening usually begins at 50 years of age and continues through 50 years of age.  Your health care provider may recommend screening at an earlier age if you have risk factors for colon cancer.  Your health care provider may also recommend using home test kits to check for hidden blood in the stool.  A small camera at the end of a tube can be used to examine your colon directly (sigmoidoscopy or colonoscopy). This is done to check for the earliest forms of colorectal  cancer.  Routine screening usually begins at age 50.  Direct examination of the colon should be repeated every 5-10 years through 50 years of age. However, you may need to be screened more often if early forms of precancerous polyps or small growths are found. Skin Cancer  Check your skin from head to toe regularly.  Tell your health care provider about any new moles or changes in moles, especially if there is a change in a mole's shape or color.  Also tell your health care provider if you have a mole that is larger than the size of a pencil eraser.  Always use sunscreen. Apply sunscreen liberally and repeatedly throughout the day.  Protect yourself by wearing long sleeves, pants, a wide-brimmed hat, and sunglasses whenever you are outside. HEART DISEASE, DIABETES, AND HIGH BLOOD PRESSURE   High blood pressure causes heart disease and increases the risk of stroke. High blood pressure is more likely to develop in:  People who have blood pressure in the high end   of the normal range (130-139/85-89 mm Hg).  People who are overweight or obese.  People who are African American.  If you are 38-23 years of age, have your blood pressure checked every 3-5 years. If you are 61 years of age or older, have your blood pressure checked every year. You should have your blood pressure measured twice--once when you are at a hospital or clinic, and once when you are not at a hospital or clinic. Record the average of the two measurements. To check your blood pressure when you are not at a hospital or clinic, you can use:  An automated blood pressure machine at a pharmacy.  A home blood pressure monitor.  If you are between 45 years and 39 years old, ask your health care provider if you should take aspirin to prevent strokes.  Have regular diabetes screenings. This involves taking a blood sample to check your fasting blood sugar level.  If you are at a normal weight and have a low risk for diabetes,  have this test once every three years after 50 years of age.  If you are overweight and have a high risk for diabetes, consider being tested at a younger age or more often. PREVENTING INFECTION  Hepatitis B  If you have a higher risk for hepatitis B, you should be screened for this virus. You are considered at high risk for hepatitis B if:  You were born in a country where hepatitis B is common. Ask your health care provider which countries are considered high risk.  Your parents were born in a high-risk country, and you have not been immunized against hepatitis B (hepatitis B vaccine).  You have HIV or AIDS.  You use needles to inject street drugs.  You live with someone who has hepatitis B.  You have had sex with someone who has hepatitis B.  You get hemodialysis treatment.  You take certain medicines for conditions, including cancer, organ transplantation, and autoimmune conditions. Hepatitis C  Blood testing is recommended for:  Everyone born from [birth year] through 1965.  Anyone with known risk factors for hepatitis C. Sexually transmitted infections (STIs)  You should be screened for sexually transmitted infections (STIs) including gonorrhea and chlamydia if:  You are sexually active and are younger than 50 years of age.  You are older than 50 years of age and your health care provider tells you that you are at risk for this type of infection.  Your sexual activity has changed since you were last screened and you are at an increased risk for chlamydia or gonorrhea. Ask your health care provider if you are at risk.  If you do not have HIV, but are at risk, it may be recommended that you take a prescription medicine daily to prevent HIV infection. This is called pre-exposure prophylaxis (PrEP). You are considered at risk if:  You are sexually active and do not regularly use condoms or know the HIV status of your partner(s).  You take drugs by injection.  You are sexually  active with a partner who has HIV. Talk with your health care provider about whether you are at high risk of being infected with HIV. If you choose to begin PrEP, you should first be tested for HIV. You should then be tested every 3 months for as long as you are taking PrEP.  PREGNANCY   If you are premenopausal and you may become pregnant, ask your health care provider about preconception counseling.  If you may  become pregnant, take 400 to 800 micrograms (mcg) of folic acid every day.  If you want to prevent pregnancy, talk to your health care provider about birth control (contraception). OSTEOPOROSIS AND MENOPAUSE   Osteoporosis is a disease in which the bones lose minerals and strength with aging. This can result in serious bone fractures. Your risk for osteoporosis can be identified using a bone density scan.  If you are 61 years of age or older, or if you are at risk for osteoporosis and fractures, ask your health care provider if you should be screened.  Ask your health care provider whether you should take a calcium or vitamin D supplement to lower your risk for osteoporosis.  Menopause may have certain physical symptoms and risks.  Hormone replacement therapy may reduce some of these symptoms and risks. Talk to your health care provider about whether hormone replacement therapy is right for you.  HOME CARE INSTRUCTIONS   Schedule regular health, dental, and eye exams.  Stay current with your immunizations.   Do not use any tobacco products including cigarettes, chewing tobacco, or electronic cigarettes.  If you are pregnant, do not drink alcohol.  If you are breastfeeding, limit how much and how often you drink alcohol.  Limit alcohol intake to no more than 1 drink per day for nonpregnant women. One drink equals 12 ounces of beer, 5 ounces of wine, or 1 ounces of hard liquor.  Do not use street drugs.  Do not share needles.  Ask your health care provider for help if  you need support or information about quitting drugs.  Tell your health care provider if you often feel depressed.  Tell your health care provider if you have ever been abused or do not feel safe at home.   This information is not intended to replace advice given to you by your health care provider. Make sure you discuss any questions you have with your health care provider.   Document Released: 07/18/2010 Document Revised: 01/23/2014 Document Reviewed: 12/04/2012 Elsevier Interactive Patient Education [employer].

## 2015-03-27 LAB — CMP14+EGFR
A/G RATIO: 1.5 (ref 1.1–2.5)
ALK PHOS: 53 IU/L (ref 39–117)
ALT: 23 IU/L (ref 0–32)
AST: 24 IU/L (ref 0–40)
Albumin: 4.6 g/dL (ref 3.5–5.5)
BUN/Creatinine Ratio: 16 (ref 9–23)
BUN: 12 mg/dL (ref 6–24)
Bilirubin Total: 0.3 mg/dL (ref 0.0–1.2)
CO2: 21 mmol/L (ref 18–29)
Calcium: 9.4 mg/dL (ref 8.7–10.2)
Chloride: 97 mmol/L (ref 96–106)
Creatinine, Ser: 0.77 mg/dL (ref 0.57–1.00)
GFR calc Af Amer: 105 mL/min/{1.73_m2} (ref >59)
GFR calc non Af Amer: 91 mL/min/{1.73_m2} (ref >59)
GLOBULIN, TOTAL: 3 g/dL (ref 1.5–4.5)
Glucose: 84 mg/dL (ref 65–99)
POTASSIUM: 5.5 mmol/L — AB (ref 3.5–5.2)
SODIUM: 137 mmol/L (ref 134–144)
Total Protein: 7.6 g/dL (ref 6.0–8.5)

## 2015-03-27 LAB — LIPID PANEL
CHOL/HDL RATIO: 4 ratio (ref 0.0–4.4)
CHOLESTEROL TOTAL: 225 mg/dL — AB (ref 100–199)
HDL: 56 mg/dL (ref >39)
LDL Calculated: 133 mg/dL — ABNORMAL HIGH (ref 0–99)
TRIGLYCERIDES: 181 mg/dL — AB (ref 0–149)
VLDL Cholesterol Cal: 36 mg/dL (ref 5–40)

## 2015-04-21 DIAGNOSIS — S43402A Unspecified sprain of left shoulder joint, initial encounter: Secondary | ICD-10-CM | POA: Diagnosis not present

## 2015-04-23 ENCOUNTER — Ambulatory Visit (INDEPENDENT_AMBULATORY_CARE_PROVIDER_SITE_OTHER): Payer: BLUE CROSS/BLUE SHIELD | Admitting: Nurse Practitioner

## 2015-04-23 ENCOUNTER — Encounter (INDEPENDENT_AMBULATORY_CARE_PROVIDER_SITE_OTHER): Payer: Self-pay

## 2015-04-23 ENCOUNTER — Encounter: Payer: Self-pay | Admitting: Nurse Practitioner

## 2015-04-23 VITALS — BP 140/95 | HR 93 | Temp 97.4°F | Ht 65.0 in | Wt 200.0 lb

## 2015-04-23 DIAGNOSIS — M898X1 Other specified disorders of bone, shoulder: Secondary | ICD-10-CM | POA: Diagnosis not present

## 2015-04-23 MED ORDER — PREDNISONE 10 MG (21) PO TBPK
10.0000 mg | ORAL_TABLET | Freq: Every day | ORAL | Status: DC
Start: 2015-04-23 — End: 2016-02-16

## 2015-04-23 MED ORDER — TRAMADOL HCL 50 MG PO TABS
50.0000 mg | ORAL_TABLET | Freq: Three times a day (TID) | ORAL | Status: DC | PRN
Start: 2015-04-23 — End: 2016-02-16

## 2015-04-23 NOTE — Progress Notes (Signed)
   Subjective:    Patient ID: Gabriela Mccormick, female    DOB: Feb 11, 1965, 50 y.o.   MRN: [id number]  HPI Patient in c/o left shoulder pain- was moving furniture over the weekend but does not remember hurting anything. Woke up Monday morning with pain around left shoulder blade. She went to urgent care on Wednesday- x rays were negative- she was given a ahot and robaxin and naprosyn. Has gotten no better. Rates pain currently 8/10. Movemnet increases pain. Heat helps the most but lowest pain will get is 5/10. When removes heat pain comes right back.   Review of Systems  Constitutional: Negative.   HENT: Negative.   Respiratory: Negative.   Cardiovascular: Negative.   Genitourinary: Negative.   Neurological: Negative.   Psychiatric/Behavioral: Negative.   All other systems reviewed and are negative.      Objective:   Physical Exam  Constitutional: She is oriented to person, place, and time. She appears well-developed and well-nourished. No distress.  Cardiovascular: Normal rate, regular rhythm and normal heart sounds.   Pulmonary/Chest: Effort normal and breath sounds normal.  Musculoskeletal:  Left upper periscapular pain on palpation- no nodules noted FROM of left shoulder without pain Grips equal bil.  Neurological: She is alert and oriented to person, place, and time.  Skin: Skin is warm.  Psychiatric: She has a normal mood and affect. Her behavior is normal. Judgment and thought content normal.   BP 140/95 mmHg  Pulse 93  Temp(Src) 97.4 F (36.3 C) (Oral)  Ht 5\' 5"  (1.651 m)  Wt 200 lb (90.719 kg)  BMI 33.28 kg/m2        Assessment & Plan:  1. Periscapular pain Continue moist heat  RTO prn - predniSONE (STERAPRED UNI-PAK 21 TAB) 10 MG (21) TBPK tablet; Take 1 tablet (10 mg total) by mouth daily. As directed x 6 days  Dispense: 21 tablet; Refill: 0 - traMADol (ULTRAM) 50 MG tablet; Take 1 tablet (50 mg total) by mouth every 8 (eight) hours as needed.  Dispense: 30  tablet; Refill: 0  [name], FNP

## 2015-04-29 DIAGNOSIS — M5412 Radiculopathy, cervical region: Secondary | ICD-10-CM | POA: Diagnosis not present

## 2015-04-29 DIAGNOSIS — M542 Cervicalgia: Secondary | ICD-10-CM | POA: Diagnosis not present

## 2015-05-07 DIAGNOSIS — M5412 Radiculopathy, cervical region: Secondary | ICD-10-CM | POA: Diagnosis not present

## 2015-05-10 DIAGNOSIS — M5412 Radiculopathy, cervical region: Secondary | ICD-10-CM | POA: Diagnosis not present

## 2015-05-10 DIAGNOSIS — M79622 Pain in left upper arm: Secondary | ICD-10-CM | POA: Diagnosis not present

## 2015-05-10 DIAGNOSIS — M542 Cervicalgia: Secondary | ICD-10-CM | POA: Diagnosis not present

## 2015-05-21 DIAGNOSIS — M5010 Cervical disc disorder with radiculopathy, unspecified cervical region: Secondary | ICD-10-CM | POA: Diagnosis not present

## 2015-05-21 DIAGNOSIS — M5412 Radiculopathy, cervical region: Secondary | ICD-10-CM | POA: Diagnosis not present

## 2015-06-04 DIAGNOSIS — M79602 Pain in left arm: Secondary | ICD-10-CM | POA: Diagnosis not present

## 2015-06-04 DIAGNOSIS — M5412 Radiculopathy, cervical region: Secondary | ICD-10-CM | POA: Diagnosis not present

## 2015-06-15 ENCOUNTER — Other Ambulatory Visit: Payer: Self-pay | Admitting: Nurse Practitioner

## 2015-06-16 NOTE — Telephone Encounter (Signed)
Last seen 04/23/15  MMM If approved route to nurse to call into [facility]  [phone]

## 2015-06-17 NOTE — Telephone Encounter (Signed)
Please call in xanax with 1 refills 

## 2015-06-17 NOTE — Telephone Encounter (Signed)
RX for Xanax called into [facility] Okayed per MMM Left message for pt

## 2015-06-22 DIAGNOSIS — M79622 Pain in left upper arm: Secondary | ICD-10-CM | POA: Diagnosis not present

## 2015-06-22 DIAGNOSIS — M542 Cervicalgia: Secondary | ICD-10-CM | POA: Diagnosis not present

## 2015-06-22 DIAGNOSIS — M5412 Radiculopathy, cervical region: Secondary | ICD-10-CM | POA: Diagnosis not present

## 2015-07-15 ENCOUNTER — Other Ambulatory Visit: Payer: Self-pay | Admitting: Nurse Practitioner

## 2015-08-05 ENCOUNTER — Other Ambulatory Visit: Payer: Self-pay | Admitting: Nurse Practitioner

## 2015-08-06 DIAGNOSIS — M5412 Radiculopathy, cervical region: Secondary | ICD-10-CM | POA: Diagnosis not present

## 2015-08-30 DIAGNOSIS — M542 Cervicalgia: Secondary | ICD-10-CM | POA: Diagnosis not present

## 2015-09-27 ENCOUNTER — Other Ambulatory Visit: Payer: Self-pay | Admitting: Nurse Practitioner

## 2015-09-28 ENCOUNTER — Other Ambulatory Visit: Payer: Self-pay | Admitting: Nurse Practitioner

## 2015-09-28 NOTE — Telephone Encounter (Signed)
Please call in alprazolam with 0 refills Last refill without being seen  

## 2015-10-10 ENCOUNTER — Other Ambulatory Visit: Payer: Self-pay | Admitting: Nurse Practitioner

## 2015-11-05 ENCOUNTER — Other Ambulatory Visit: Payer: Self-pay | Admitting: Nurse Practitioner

## 2015-11-08 NOTE — Telephone Encounter (Signed)
Last filled 09/28/15, last seen 05/03/15. Call in

## 2015-11-08 NOTE — Telephone Encounter (Signed)
Please call in xanax with 1 refills 

## 2015-11-08 NOTE — Telephone Encounter (Signed)
Refill called to [facility] w/ 1 RF per written note

## 2015-11-19 ENCOUNTER — Other Ambulatory Visit: Payer: Self-pay | Admitting: *Deleted

## 2015-11-19 MED ORDER — SERTRALINE HCL 100 MG PO TABS: ORAL_TABLET | ORAL | 5 refills | Status: DC

## 2015-11-22 MED ORDER — SERTRALINE HCL 100 MG PO TABS: ORAL_TABLET | ORAL | 1 refills | Status: DC

## 2015-11-22 NOTE — Addendum Note (Signed)
Addended by: Thana Ates on: 11/22/2015 01:40 PM   Modules accepted: Orders

## 2016-01-05 ENCOUNTER — Other Ambulatory Visit: Payer: Self-pay | Admitting: Nurse Practitioner

## 2016-02-07 ENCOUNTER — Other Ambulatory Visit: Payer: Self-pay | Admitting: Nurse Practitioner

## 2016-02-09 NOTE — Telephone Encounter (Signed)
Patient last seen in office on 04-23-15. Rx last filled on 12-20-15 for #[phone]. Please advise

## 2016-02-10 NOTE — Telephone Encounter (Signed)
[facility] for refill- has not been sen since 4/17

## 2016-02-10 NOTE — Telephone Encounter (Signed)
Pt aware [employer]. Appt scheduled for 1/31 at 4 pm while we were on the phone

## 2016-02-11 ENCOUNTER — Other Ambulatory Visit: Payer: Self-pay | Admitting: Nurse Practitioner

## 2016-02-11 MED ORDER — ALPRAZOLAM 0.5 MG PO TABS: 0.5000 mg | ORAL_TABLET | Freq: Three times a day (TID) | ORAL | 0 refills | Status: DC | PRN

## 2016-02-11 NOTE — Progress Notes (Signed)
Please call in xanax with 0.5mg  TID #[phone]  refills

## 2016-02-11 NOTE — Progress Notes (Signed)
rx called into pharmacy and pt is aware. 

## 2016-02-16 ENCOUNTER — Ambulatory Visit (INDEPENDENT_AMBULATORY_CARE_PROVIDER_SITE_OTHER): Payer: BLUE CROSS/BLUE SHIELD | Admitting: Nurse Practitioner

## 2016-02-16 ENCOUNTER — Encounter: Payer: Self-pay | Admitting: Nurse Practitioner

## 2016-02-16 VITALS — BP 130/84 | HR 94 | Temp 98.3°F | Ht 65.0 in | Wt 196.0 lb

## 2016-02-16 DIAGNOSIS — I10 Essential (primary) hypertension: Secondary | ICD-10-CM

## 2016-02-16 DIAGNOSIS — Z1329 Encounter for screening for other suspected endocrine disorder: Secondary | ICD-10-CM

## 2016-02-16 DIAGNOSIS — Z6833 Body mass index (BMI) 33.0-33.9, adult: Secondary | ICD-10-CM

## 2016-02-16 DIAGNOSIS — E785 Hyperlipidemia, unspecified: Secondary | ICD-10-CM

## 2016-02-16 DIAGNOSIS — F3342 Major depressive disorder, recurrent, in full remission: Secondary | ICD-10-CM | POA: Diagnosis not present

## 2016-02-16 DIAGNOSIS — Z1231 Encounter for screening mammogram for malignant neoplasm of breast: Secondary | ICD-10-CM | POA: Diagnosis not present

## 2016-02-16 DIAGNOSIS — K219 Gastro-esophageal reflux disease without esophagitis: Secondary | ICD-10-CM | POA: Diagnosis not present

## 2016-02-16 DIAGNOSIS — Z1211 Encounter for screening for malignant neoplasm of colon: Secondary | ICD-10-CM

## 2016-02-16 DIAGNOSIS — Z1212 Encounter for screening for malignant neoplasm of rectum: Secondary | ICD-10-CM | POA: Diagnosis not present

## 2016-02-16 MED ORDER — ALPRAZOLAM 0.5 MG PO TABS: 0.5000 mg | ORAL_TABLET | Freq: Three times a day (TID) | ORAL | 1 refills | Status: DC | PRN

## 2016-02-16 MED ORDER — LISINOPRIL 10 MG PO TABS: 10.0000 mg | ORAL_TABLET | Freq: Every day | ORAL | 0 refills | Status: DC

## 2016-02-16 NOTE — Patient Instructions (Signed)
Fat and Cholesterol Restricted Diet Introduction Getting too much fat and cholesterol in your diet may cause health problems. Following this diet helps keep your fat and cholesterol at normal levels. This can keep you from getting sick. What types of fat should I choose?  Choose monosaturated and polyunsaturated fats. These are found in foods such as olive oil, canola oil, flaxseeds, walnuts, almonds, and seeds.  Eat more omega-3 fats. Good choices include salmon, mackerel, sardines, tuna, flaxseed oil, and ground flaxseeds.  Limit saturated fats. These are in animal products such as meats, butter, and cream. They can also be in plant products such as palm oil, palm kernel oil, and coconut oil.  Avoid foods with partially hydrogenated oils in them. These contain trans fats. Examples of foods that have trans fats are stick margarine, some tub margarines, cookies, crackers, and other baked goods. What general guidelines do I need to follow?  Check food labels. Look for the words "trans fat" and "saturated fat."  When preparing a meal:  Fill half of your plate with vegetables and green salads.  Fill one fourth of your plate with whole grains. Look for the word "whole" as the first word in the ingredient list.  Fill one fourth of your plate with lean protein foods.  Eat more foods that have fiber, like apples, carrots, beans, peas, and barley.  Eat more home-cooked foods. Eat less at restaurants and buffets.  Limit or avoid alcohol.  Limit foods high in starch and sugar.  Limit fried foods.  Cook foods without frying them. Baking, boiling, grilling, and broiling are all great options.  Lose weight if you are overweight. Losing even a small amount of weight can help your overall health. It can also help prevent diseases such as diabetes and heart disease. What foods can I eat? Grains  Whole grains, such as whole wheat or whole grain breads, crackers, cereals, and pasta. Unsweetened  oatmeal, bulgur, barley, quinoa, or brown rice. Corn or whole wheat flour tortillas. Vegetables  Fresh or frozen vegetables (raw, steamed, roasted, or grilled). Green salads. Fruits  All fresh, canned (in natural juice), or frozen fruits. Meat and Other Protein Products  Ground beef (85% or leaner), grass-fed beef, or beef trimmed of fat. Skinless chicken or turkey. Ground chicken or turkey. Pork trimmed of fat. All fish and seafood. Eggs. Dried beans, peas, or lentils. Unsalted nuts or seeds. Unsalted canned or dry beans. Dairy  Low-fat dairy products, such as skim or 1% milk, 2% or reduced-fat cheeses, low-fat ricotta or cottage cheese, or plain low-fat yogurt. Fats and Oils  Tub margarines without trans fats. Light or reduced-fat mayonnaise and salad dressings. Avocado. Olive, canola, sesame, or safflower oils. Natural peanut or almond butter (choose ones without added sugar and oil). The items listed above may not be a complete list of recommended foods or beverages. Contact your dietitian for more options.  What foods are not recommended? Grains  White bread. White pasta. White rice. Cornbread. Bagels, pastries, and croissants. Crackers that contain trans fat. Vegetables  White potatoes. Corn. Creamed or fried vegetables. Vegetables in a cheese sauce. Fruits  Dried fruits. Canned fruit in light or heavy syrup. Fruit juice. Meat and Other Protein Products  Fatty cuts of meat. Ribs, chicken wings, bacon, sausage, bologna, salami, chitterlings, fatback, hot dogs, bratwurst, and packaged luncheon meats. Liver and organ meats. Dairy  Whole or 2% milk, cream, half-and-half, and cream cheese. Whole milk cheeses. Whole-fat or sweetened yogurt. Full-fat cheeses. Nondairy creamers and   whipped toppings. Processed cheese, cheese spreads, or cheese curds. Sweets and Desserts  Corn syrup, sugars, honey, and molasses. Candy. Jam and jelly. Syrup. Sweetened cereals. Cookies, pies, cakes, donuts,  muffins, and ice cream. Fats and Oils  Butter, stick margarine, lard, shortening, ghee, or bacon fat. Coconut, palm kernel, or palm oils. Beverages  Alcohol. Sweetened drinks (such as sodas, lemonade, and fruit drinks or punches). The items listed above may not be a complete list of foods and beverages to avoid. Contact your dietitian for more information.  This information is not intended to replace advice given to you by your health care provider. Make sure you discuss any questions you have with your health care provider. Document Released: 07/04/2011 Document Revised: 09/09/2015 Document Reviewed: 04/03/2013  2017 Elsevier  

## 2016-02-16 NOTE — Progress Notes (Signed)
Subjective:    Patient ID: Gabriela Mccormick, female    DOB: 06-02-1965, 51 y.o.   MRN: [id number]  Patient here today for follow up of chronic medical problems.  Outpatient Encounter Prescriptions as of 02/16/2016  Medication Sig  . ALPRAZolam (XANAX) 0.5 MG tablet Take 1 tablet (0.5 mg total) by mouth 3 (three) times daily as needed.  Marland Kitchen EPIPEN 2-PAK 0.3 MG/0.3ML SOAJ injection INJECT 0.3 MLS (0.3 MG TOTAL) INTO THE MUSCLE ONCE.  Marland Kitchen lisinopril (PRINIVIL,ZESTRIL) 10 MG tablet Take 1 tablet (10 mg total) by mouth daily.  . Naltrexone-Bupropion HCl ER (CONTRAVE) 8-90 MG TB12 1 tab po Qd X7d, then 1 tab po BID X7d, Then 2tab in am and 1 tab Pm X7days , then 2 tab po BId  . sertraline (ZOLOFT) 100 MG tablet TAKE 1 TABLET (100 MG TOTAL) BY MOUTH DAILY.    Hypertension  This is a chronic problem. The current episode started more than 1 year ago. The problem is unchanged. The problem is controlled. Pertinent negatives include no chest pain, headaches, palpitations or peripheral edema. Risk factors for coronary artery disease include obesity. Past treatments include ACE inhibitors. The current treatment provides moderate improvement. Compliance problems include diet and exercise.  There is no history of CAD/MI.  Hyperlipidemia  This is a chronic problem. The current episode started more than 1 year ago. The problem is controlled. Recent lipid tests were reviewed and are variable. There are no known factors aggravating her hyperlipidemia. Pertinent negatives include no chest pain. She is currently on no antihyperlipidemic treatment (paient has refused statins in the past). The current treatment provides moderate improvement of lipids. Compliance problems include adherence to diet and adherence to exercise.  Risk factors for coronary artery disease include dyslipidemia and hypertension.  DEPRESSION Patient is taking zoloft and reports doing well. Uses xanax on a prn basis Depression screen [facility] 2/9  02/16/2016 03/26/2015 09/04/2014 09/26/2013 04/11/2013  Decreased Interest 0 0 0 0 0  Down, Depressed, Hopeless 0 0 0 0 0  PHQ - 2 Score 0 0 0 0 0    GERD Currently on prevacid 2x a day- getting better- symptoms are much better. BMI elevation Was 33 at last visit- was stated oncontrave- no side effects form meds.  Review of Systems  HENT: Negative.   Eyes: Negative.   Respiratory: Negative.   Cardiovascular: Negative for chest pain and palpitations.  Gastrointestinal: Negative.   Endocrine: Negative.   Genitourinary: Negative.   Musculoskeletal: Negative.   Skin: Negative.   Allergic/Immunologic: Negative.   Neurological: Negative.  Negative for headaches.  Hematological: Negative.   Psychiatric/Behavioral: Negative.        Objective:   Physical Exam  Constitutional: She is oriented to person, place, and time. She appears well-developed and well-nourished.  HENT:  Head: Normocephalic.  Right Ear: Hearing, tympanic membrane, external ear and ear canal normal.  Left Ear: Hearing, tympanic membrane, external ear and ear canal normal.  Nose: Nose normal.  Mouth/Throat: Uvula is midline, oropharynx is clear and moist and mucous membranes are normal.  Eyes: Conjunctivae and EOM are normal. Pupils are equal, round, and reactive to light.  Neck: Normal range of motion. Neck supple. No JVD present. No thyromegaly present.  Cardiovascular: Normal rate, normal heart sounds and intact distal pulses.   No murmur heard. Pulmonary/Chest: Effort normal and breath sounds normal. She has no wheezes. She has no rales.  Abdominal: Soft. Bowel sounds are normal. She exhibits no mass.  Musculoskeletal: Normal range  of motion.  Neurological: She is alert and oriented to person, place, and time. She has normal reflexes.  Skin: Skin is warm and dry.  Psychiatric: She has a normal mood and affect. Her behavior is normal. Judgment and thought content normal.   BP 130/84   Pulse 94   Temp 98.3 F  (36.8 C) (Oral)   Ht 5\' 5"  (1.651 m)   Wt 196 lb (88.9 kg)   BMI 32.62 kg/m        Assessment & Plan:   1. Encounter for colorectal cancer screening - Fecal occult blood, imunochemical; Future  2. Screening mammogram, encounter for - MM Digital Screening; Future  3. Essential hypertension, benign ;ow sodium diet - lisinopril (PRINIVIL,ZESTRIL) 10 MG tablet; Take 1 tablet (10 mg total) by mouth daily.  Dispense: 90 tablet; Refill: 0  4. Gastroesophageal reflux disease without esophagitis Avoid spicy foods Do not eat 2 hours prior to bedtime  5. BMI 33.0-33.9,adult Discussed diet and exercise for person with BMI >25 Will recheck weight in 3-6 months  6. Recurrent major depressive disorder, in full remission ([place]) Stress management - ALPRAZolam (XANAX) 0.5 MG tablet; Take 1 tablet (0.5 mg total) by mouth 3 (three) times daily as needed.  Dispense: 90 tablet; Refill: 1    Labs pending Health maintenance reviewed Diet and exercise encouraged Continue all meds Follow up  In 6 months   [place], FNP

## 2016-02-16 NOTE — Addendum Note (Signed)
Addended by: Rolena Infante on: 02/16/2016 04:33 PM   Modules accepted: Orders

## 2016-02-17 LAB — LIPID PANEL
CHOLESTEROL TOTAL: 200 mg/dL — AB (ref 100–199)
Chol/HDL Ratio: 2.9 ratio units (ref 0.0–4.4)
HDL: 69 mg/dL (ref >39)
LDL Calculated: 86 mg/dL (ref 0–99)
Triglycerides: 225 mg/dL — ABNORMAL HIGH (ref 0–149)
VLDL CHOLESTEROL CAL: 45 mg/dL — AB (ref 5–40)

## 2016-02-17 LAB — CMP14+EGFR
ALK PHOS: 78 IU/L (ref 39–117)
ALT: 50 IU/L — AB (ref 0–32)
AST: 46 IU/L — ABNORMAL HIGH (ref 0–40)
Albumin/Globulin Ratio: 1.6 (ref 1.2–2.2)
Albumin: 4.4 g/dL (ref 3.5–5.5)
BILIRUBIN TOTAL: 0.3 mg/dL (ref 0.0–1.2)
BUN / CREAT RATIO: 15 (ref 9–23)
BUN: 13 mg/dL (ref 6–24)
CHLORIDE: 99 mmol/L (ref 96–106)
CO2: 24 mmol/L (ref 18–29)
Calcium: 9.4 mg/dL (ref 8.7–10.2)
Creatinine, Ser: 0.84 mg/dL (ref 0.57–1.00)
GFR calc Af Amer: 94 mL/min/{1.73_m2} (ref >59)
GFR calc non Af Amer: 81 mL/min/{1.73_m2} (ref >59)
GLUCOSE: 89 mg/dL (ref 65–99)
Globulin, Total: 2.8 g/dL (ref 1.5–4.5)
Potassium: 4.4 mmol/L (ref 3.5–5.2)
Sodium: 138 mmol/L (ref 134–144)
Total Protein: 7.2 g/dL (ref 6.0–8.5)

## 2016-02-17 LAB — THYROID PANEL WITH TSH
Free Thyroxine Index: 1.5 (ref 1.2–4.9)
T3 Uptake Ratio: 23 % — ABNORMAL LOW (ref 24–39)
T4 TOTAL: 6.5 ug/dL (ref 4.5–12.0)
TSH: 4.16 u[IU]/mL (ref 0.450–4.500)

## 2016-02-24 ENCOUNTER — Encounter: Payer: Self-pay | Admitting: *Deleted

## 2016-03-06 ENCOUNTER — Telehealth: Payer: Self-pay | Admitting: Nurse Practitioner

## 2016-03-07 NOTE — Telephone Encounter (Signed)
Scheduled

## 2016-04-06 ENCOUNTER — Other Ambulatory Visit: Payer: Self-pay | Admitting: Nurse Practitioner

## 2016-04-06 DIAGNOSIS — Z6833 Body mass index (BMI) 33.0-33.9, adult: Secondary | ICD-10-CM

## 2016-05-26 ENCOUNTER — Ambulatory Visit: Payer: BLUE CROSS/BLUE SHIELD | Admitting: Nurse Practitioner

## 2016-06-08 ENCOUNTER — Other Ambulatory Visit: Payer: Self-pay | Admitting: Nurse Practitioner

## 2016-06-08 DIAGNOSIS — F3342 Major depressive disorder, recurrent, in full remission: Secondary | ICD-10-CM

## 2016-06-09 NOTE — Telephone Encounter (Signed)
Please call in xanax with 1 refills 

## 2016-06-09 NOTE — Telephone Encounter (Signed)
Phoned in.

## 2016-06-09 NOTE — Telephone Encounter (Signed)
Last filled 05/01/16, last seen 02/16/16. Call in

## 2016-06-14 ENCOUNTER — Encounter: Payer: BLUE CROSS/BLUE SHIELD | Admitting: *Deleted

## 2016-07-16 DIAGNOSIS — D72829 Elevated white blood cell count, unspecified: Secondary | ICD-10-CM | POA: Diagnosis not present

## 2016-07-16 DIAGNOSIS — R188 Other ascites: Secondary | ICD-10-CM | POA: Diagnosis not present

## 2016-07-16 DIAGNOSIS — N739 Female pelvic inflammatory disease, unspecified: Secondary | ICD-10-CM | POA: Diagnosis not present

## 2016-07-16 DIAGNOSIS — R911 Solitary pulmonary nodule: Secondary | ICD-10-CM | POA: Diagnosis not present

## 2016-07-16 DIAGNOSIS — R1031 Right lower quadrant pain: Secondary | ICD-10-CM | POA: Diagnosis not present

## 2016-07-16 DIAGNOSIS — I10 Essential (primary) hypertension: Secondary | ICD-10-CM | POA: Diagnosis not present

## 2016-07-16 DIAGNOSIS — K449 Diaphragmatic hernia without obstruction or gangrene: Secondary | ICD-10-CM | POA: Diagnosis not present

## 2016-07-16 DIAGNOSIS — K651 Peritoneal abscess: Secondary | ICD-10-CM | POA: Diagnosis not present

## 2016-07-16 DIAGNOSIS — R509 Fever, unspecified: Secondary | ICD-10-CM | POA: Diagnosis not present

## 2016-07-16 DIAGNOSIS — R1013 Epigastric pain: Secondary | ICD-10-CM | POA: Diagnosis not present

## 2016-07-16 DIAGNOSIS — Z79899 Other long term (current) drug therapy: Secondary | ICD-10-CM | POA: Diagnosis not present

## 2016-07-16 DIAGNOSIS — R109 Unspecified abdominal pain: Secondary | ICD-10-CM | POA: Diagnosis not present

## 2016-07-17 DIAGNOSIS — K651 Peritoneal abscess: Secondary | ICD-10-CM | POA: Diagnosis not present

## 2016-07-18 DIAGNOSIS — K651 Peritoneal abscess: Secondary | ICD-10-CM | POA: Diagnosis not present

## 2016-07-20 DIAGNOSIS — K651 Peritoneal abscess: Secondary | ICD-10-CM | POA: Diagnosis not present

## 2016-07-28 ENCOUNTER — Other Ambulatory Visit: Payer: Self-pay | Admitting: Nurse Practitioner

## 2016-07-30 ENCOUNTER — Other Ambulatory Visit: Payer: Self-pay | Admitting: Nurse Practitioner

## 2016-07-30 DIAGNOSIS — I10 Essential (primary) hypertension: Secondary | ICD-10-CM

## 2016-08-01 DIAGNOSIS — L0291 Cutaneous abscess, unspecified: Secondary | ICD-10-CM | POA: Diagnosis not present

## 2016-08-03 DIAGNOSIS — N739 Female pelvic inflammatory disease, unspecified: Secondary | ICD-10-CM | POA: Diagnosis not present

## 2016-09-07 ENCOUNTER — Other Ambulatory Visit: Payer: Self-pay | Admitting: Nurse Practitioner

## 2016-09-07 DIAGNOSIS — F3342 Major depressive disorder, recurrent, in full remission: Secondary | ICD-10-CM

## 2016-09-11 ENCOUNTER — Other Ambulatory Visit: Payer: Self-pay | Admitting: Nurse Practitioner

## 2016-09-11 DIAGNOSIS — Z6833 Body mass index (BMI) 33.0-33.9, adult: Secondary | ICD-10-CM

## 2016-09-11 NOTE — Telephone Encounter (Signed)
rx called into pharmacy

## 2016-09-11 NOTE — Telephone Encounter (Signed)
Please call in alprazolam with 0 refills Last refill without being seen  

## 2016-09-13 DIAGNOSIS — E876 Hypokalemia: Secondary | ICD-10-CM | POA: Diagnosis not present

## 2016-09-13 DIAGNOSIS — R109 Unspecified abdominal pain: Secondary | ICD-10-CM | POA: Diagnosis not present

## 2016-09-13 DIAGNOSIS — K529 Noninfective gastroenteritis and colitis, unspecified: Secondary | ICD-10-CM | POA: Diagnosis not present

## 2016-09-13 DIAGNOSIS — Z79899 Other long term (current) drug therapy: Secondary | ICD-10-CM | POA: Diagnosis not present

## 2016-09-13 DIAGNOSIS — K760 Fatty (change of) liver, not elsewhere classified: Secondary | ICD-10-CM | POA: Diagnosis not present

## 2016-09-13 DIAGNOSIS — I10 Essential (primary) hypertension: Secondary | ICD-10-CM | POA: Diagnosis not present

## 2016-09-13 DIAGNOSIS — A09 Infectious gastroenteritis and colitis, unspecified: Secondary | ICD-10-CM | POA: Diagnosis not present

## 2016-09-13 DIAGNOSIS — K219 Gastro-esophageal reflux disease without esophagitis: Secondary | ICD-10-CM | POA: Diagnosis not present

## 2016-09-13 DIAGNOSIS — F329 Major depressive disorder, single episode, unspecified: Secondary | ICD-10-CM | POA: Diagnosis not present

## 2016-09-13 DIAGNOSIS — K659 Peritonitis, unspecified: Secondary | ICD-10-CM | POA: Diagnosis not present

## 2016-09-13 DIAGNOSIS — A419 Sepsis, unspecified organism: Secondary | ICD-10-CM | POA: Diagnosis not present

## 2016-09-13 DIAGNOSIS — K6389 Other specified diseases of intestine: Secondary | ICD-10-CM | POA: Diagnosis not present

## 2016-09-13 DIAGNOSIS — R1110 Vomiting, unspecified: Secondary | ICD-10-CM | POA: Diagnosis not present

## 2016-09-13 DIAGNOSIS — R112 Nausea with vomiting, unspecified: Secondary | ICD-10-CM | POA: Diagnosis not present

## 2016-09-13 DIAGNOSIS — R1084 Generalized abdominal pain: Secondary | ICD-10-CM | POA: Diagnosis not present

## 2016-09-13 DIAGNOSIS — E785 Hyperlipidemia, unspecified: Secondary | ICD-10-CM | POA: Diagnosis not present

## 2016-09-13 DIAGNOSIS — E860 Dehydration: Secondary | ICD-10-CM | POA: Diagnosis not present

## 2016-09-14 DIAGNOSIS — A09 Infectious gastroenteritis and colitis, unspecified: Secondary | ICD-10-CM | POA: Diagnosis not present

## 2016-09-14 DIAGNOSIS — K529 Noninfective gastroenteritis and colitis, unspecified: Secondary | ICD-10-CM | POA: Diagnosis not present

## 2016-09-14 DIAGNOSIS — E860 Dehydration: Secondary | ICD-10-CM | POA: Diagnosis not present

## 2016-09-14 DIAGNOSIS — R112 Nausea with vomiting, unspecified: Secondary | ICD-10-CM | POA: Diagnosis not present

## 2016-09-14 DIAGNOSIS — I10 Essential (primary) hypertension: Secondary | ICD-10-CM | POA: Diagnosis not present

## 2016-09-14 DIAGNOSIS — F329 Major depressive disorder, single episode, unspecified: Secondary | ICD-10-CM | POA: Diagnosis not present

## 2016-09-15 DIAGNOSIS — F329 Major depressive disorder, single episode, unspecified: Secondary | ICD-10-CM | POA: Diagnosis not present

## 2016-09-15 DIAGNOSIS — R112 Nausea with vomiting, unspecified: Secondary | ICD-10-CM | POA: Diagnosis not present

## 2016-09-15 DIAGNOSIS — I10 Essential (primary) hypertension: Secondary | ICD-10-CM | POA: Diagnosis not present

## 2016-09-15 DIAGNOSIS — A09 Infectious gastroenteritis and colitis, unspecified: Secondary | ICD-10-CM | POA: Diagnosis not present

## 2016-09-15 DIAGNOSIS — E860 Dehydration: Secondary | ICD-10-CM | POA: Diagnosis not present

## 2016-09-15 DIAGNOSIS — K529 Noninfective gastroenteritis and colitis, unspecified: Secondary | ICD-10-CM | POA: Diagnosis not present

## 2016-10-05 DIAGNOSIS — D127 Benign neoplasm of rectosigmoid junction: Secondary | ICD-10-CM | POA: Diagnosis not present

## 2016-10-05 DIAGNOSIS — Z1211 Encounter for screening for malignant neoplasm of colon: Secondary | ICD-10-CM | POA: Diagnosis not present

## 2016-10-16 ENCOUNTER — Telehealth: Payer: Self-pay | Admitting: Nurse Practitioner

## 2016-10-19 DIAGNOSIS — K3532 Acute appendicitis with perforation and localized peritonitis, without abscess: Secondary | ICD-10-CM | POA: Diagnosis not present

## 2016-10-19 DIAGNOSIS — R112 Nausea with vomiting, unspecified: Secondary | ICD-10-CM | POA: Diagnosis not present

## 2016-10-19 DIAGNOSIS — R109 Unspecified abdominal pain: Secondary | ICD-10-CM | POA: Diagnosis not present

## 2016-10-26 DIAGNOSIS — Z79899 Other long term (current) drug therapy: Secondary | ICD-10-CM | POA: Diagnosis not present

## 2016-10-26 DIAGNOSIS — R1110 Vomiting, unspecified: Secondary | ICD-10-CM | POA: Diagnosis not present

## 2016-10-26 DIAGNOSIS — E785 Hyperlipidemia, unspecified: Secondary | ICD-10-CM | POA: Diagnosis not present

## 2016-10-26 DIAGNOSIS — I10 Essential (primary) hypertension: Secondary | ICD-10-CM | POA: Diagnosis not present

## 2016-10-26 DIAGNOSIS — K219 Gastro-esophageal reflux disease without esophagitis: Secondary | ICD-10-CM | POA: Diagnosis not present

## 2016-10-26 DIAGNOSIS — Z91030 Bee allergy status: Secondary | ICD-10-CM | POA: Diagnosis not present

## 2016-10-26 DIAGNOSIS — E669 Obesity, unspecified: Secondary | ICD-10-CM | POA: Diagnosis not present

## 2016-10-26 DIAGNOSIS — F329 Major depressive disorder, single episode, unspecified: Secondary | ICD-10-CM | POA: Diagnosis not present

## 2016-10-26 DIAGNOSIS — K3532 Acute appendicitis with perforation and localized peritonitis, without abscess: Secondary | ICD-10-CM | POA: Diagnosis not present

## 2016-11-01 DIAGNOSIS — R109 Unspecified abdominal pain: Secondary | ICD-10-CM | POA: Diagnosis not present

## 2016-11-01 DIAGNOSIS — Z9089 Acquired absence of other organs: Secondary | ICD-10-CM | POA: Diagnosis not present

## 2016-11-01 DIAGNOSIS — K6389 Other specified diseases of intestine: Secondary | ICD-10-CM | POA: Diagnosis not present

## 2016-11-08 ENCOUNTER — Other Ambulatory Visit: Payer: Self-pay | Admitting: Nurse Practitioner

## 2016-11-08 DIAGNOSIS — F3342 Major depressive disorder, recurrent, in full remission: Secondary | ICD-10-CM

## 2016-11-09 NOTE — Telephone Encounter (Signed)
Called in.

## 2016-11-09 NOTE — Telephone Encounter (Signed)
Last refill without being seen Please call in xanax with 0 refills

## 2016-11-16 DIAGNOSIS — K3589 Other acute appendicitis without perforation or gangrene: Secondary | ICD-10-CM | POA: Diagnosis not present

## 2016-11-17 DIAGNOSIS — C181 Malignant neoplasm of appendix: Secondary | ICD-10-CM | POA: Diagnosis not present

## 2016-12-01 DIAGNOSIS — Z9049 Acquired absence of other specified parts of digestive tract: Secondary | ICD-10-CM | POA: Diagnosis not present

## 2016-12-01 DIAGNOSIS — K388 Other specified diseases of appendix: Secondary | ICD-10-CM | POA: Diagnosis not present

## 2016-12-01 DIAGNOSIS — Z48815 Encounter for surgical aftercare following surgery on the digestive system: Secondary | ICD-10-CM | POA: Diagnosis not present

## 2016-12-04 DIAGNOSIS — K388 Other specified diseases of appendix: Secondary | ICD-10-CM | POA: Diagnosis not present

## 2016-12-06 ENCOUNTER — Other Ambulatory Visit: Payer: Self-pay | Admitting: Nurse Practitioner

## 2016-12-21 ENCOUNTER — Other Ambulatory Visit: Payer: Self-pay | Admitting: Nurse Practitioner

## 2016-12-21 DIAGNOSIS — F3342 Major depressive disorder, recurrent, in full remission: Secondary | ICD-10-CM

## 2016-12-21 DIAGNOSIS — I10 Essential (primary) hypertension: Secondary | ICD-10-CM

## 2016-12-22 NOTE — Telephone Encounter (Signed)
Last filled 11/10/16. Last seen 02/16/2016. If approved please route to pool for nurse to call into pharmacy.

## 2016-12-23 NOTE — Telephone Encounter (Signed)
rx called into pharmacy

## 2016-12-23 NOTE — Telephone Encounter (Signed)
Please call in a;lprozolam with 0 refills. Last refill without being seen

## 2017-01-29 ENCOUNTER — Other Ambulatory Visit: Payer: Self-pay | Admitting: Nurse Practitioner

## 2017-01-29 DIAGNOSIS — F3342 Major depressive disorder, recurrent, in full remission: Secondary | ICD-10-CM

## 2017-01-31 DIAGNOSIS — Z09 Encounter for follow-up examination after completed treatment for conditions other than malignant neoplasm: Secondary | ICD-10-CM | POA: Diagnosis not present

## 2017-01-31 DIAGNOSIS — K7689 Other specified diseases of liver: Secondary | ICD-10-CM | POA: Diagnosis not present

## 2017-01-31 DIAGNOSIS — Z9089 Acquired absence of other organs: Secondary | ICD-10-CM | POA: Diagnosis not present

## 2017-01-31 DIAGNOSIS — Z9049 Acquired absence of other specified parts of digestive tract: Secondary | ICD-10-CM | POA: Diagnosis not present

## 2017-01-31 DIAGNOSIS — R160 Hepatomegaly, not elsewhere classified: Secondary | ICD-10-CM | POA: Diagnosis not present

## 2017-01-31 DIAGNOSIS — K660 Peritoneal adhesions (postprocedural) (postinfection): Secondary | ICD-10-CM | POA: Diagnosis not present

## 2017-01-31 DIAGNOSIS — K668 Other specified disorders of peritoneum: Secondary | ICD-10-CM | POA: Diagnosis not present

## 2017-02-03 ENCOUNTER — Other Ambulatory Visit: Payer: Self-pay | Admitting: Nurse Practitioner

## 2017-02-03 DIAGNOSIS — F3342 Major depressive disorder, recurrent, in full remission: Secondary | ICD-10-CM

## 2017-02-05 ENCOUNTER — Other Ambulatory Visit: Payer: Self-pay | Admitting: Nurse Practitioner

## 2017-02-05 DIAGNOSIS — F3342 Major depressive disorder, recurrent, in full remission: Secondary | ICD-10-CM

## 2017-02-05 MED ORDER — ALPRAZOLAM 0.5 MG PO TABS: 0.5000 mg | ORAL_TABLET | Freq: Three times a day (TID) | ORAL | 0 refills | Status: DC

## 2017-02-14 ENCOUNTER — Encounter: Payer: Self-pay | Admitting: Nurse Practitioner

## 2017-02-14 ENCOUNTER — Ambulatory Visit: Payer: BLUE CROSS/BLUE SHIELD | Admitting: Nurse Practitioner

## 2017-02-14 VITALS — BP 145/84 | HR 102 | Temp 98.1°F | Ht 65.0 in | Wt 195.0 lb

## 2017-02-14 DIAGNOSIS — Z6833 Body mass index (BMI) 33.0-33.9, adult: Secondary | ICD-10-CM

## 2017-02-14 DIAGNOSIS — E785 Hyperlipidemia, unspecified: Secondary | ICD-10-CM | POA: Diagnosis not present

## 2017-02-14 DIAGNOSIS — I10 Essential (primary) hypertension: Secondary | ICD-10-CM | POA: Diagnosis not present

## 2017-02-14 DIAGNOSIS — F3342 Major depressive disorder, recurrent, in full remission: Secondary | ICD-10-CM

## 2017-02-14 DIAGNOSIS — K219 Gastro-esophageal reflux disease without esophagitis: Secondary | ICD-10-CM | POA: Diagnosis not present

## 2017-02-14 MED ORDER — ALPRAZOLAM 0.5 MG PO TABS: 0.5000 mg | ORAL_TABLET | Freq: Three times a day (TID) | ORAL | 0 refills | Status: DC

## 2017-02-14 MED ORDER — LISINOPRIL 10 MG PO TABS: 10.0000 mg | ORAL_TABLET | Freq: Every day | ORAL | 0 refills | Status: DC

## 2017-02-14 MED ORDER — SERTRALINE HCL 100 MG PO TABS: ORAL_TABLET | ORAL | 1 refills | Status: DC

## 2017-02-14 NOTE — Progress Notes (Signed)
Subjective:    Patient ID: Gabriela Mccormick, female    DOB: 04/14/1965, 52 y.o.   MRN: [id number]  HPI  Pernell Dikes is here today for follow up of chronic medical problem.  Outpatient Encounter Medications as of 02/14/2017  Medication Sig  . ALPRAZolam (XANAX) 0.5 MG tablet Take 1 tablet (0.5 mg total) by mouth 3 (three) times daily.  Marland Kitchen EPINEPHRINE 0.3 mg/0.3 mL IJ SOAJ injection INJECT 0.3 MLS (0.3 MG TOTAL) INTO THE MUSCLE ONCE.  Marland Kitchen lisinopril (PRINIVIL,ZESTRIL) 10 MG tablet TAKE 1 TABLET (10 MG TOTAL) BY MOUTH DAILY.  Marland Kitchen sertraline (ZOLOFT) 100 MG tablet TAKE 1 TABLET (100 MG TOTAL) BY MOUTH DAILY.      Marland Kitchen CONTRAVE 8-90 MG TB12 1TAB BY MOUTH EVERY DAYX7,THEN 1TAB TWICE A DAYX7,THEN 2TAB IN AM AND 1 TAB PMX7,THEN 2TABS 2XDAY (Patient not taking: Reported on 02/14/2017)    1. Essential hypertension, benign  No c/o chest pain , sob or headache. Does not check blood pressure at home. BP Readings from Last 3 Encounters:  02/14/17 (!) 145/84  02/16/16 130/84  04/23/15 (!) 140/95     2. Gastroesophageal reflux disease without esophagitis  Only takes otc meds when needed  3. Recurrent major depressive disorder, in full remission ([place])  Is currently on zoloft and is stable. She has had a rough year  4. BMI 33.0-33.9,adult  No recent weight changes  5. Hyperlipidemia with target LDL less than 100  Does watch diet     New complaints: Had ruptured appendix in September and got septic. It released "mucin" in her system and now shehas to have ct scans every 3 months. She is worried about that.  Social history: Husband has ben very sick- he is an alcoholic and she says taht she drinks wine at night to sleep.   Review of Systems  Constitutional: Negative for activity change and appetite change.  HENT: Negative.   Eyes: Negative for pain.  Respiratory: Negative for shortness of breath.   Cardiovascular: Negative for chest pain, palpitations and leg swelling.  Gastrointestinal:  Negative for abdominal pain.  Endocrine: Negative for polydipsia.  Genitourinary: Negative.   Skin: Negative for rash.  Neurological: Negative for dizziness, weakness and headaches.  Hematological: Does not bruise/bleed easily.  Psychiatric/Behavioral: Negative.   All other systems reviewed and are negative.      Objective:   Physical Exam  Constitutional: She is oriented to person, place, and time. She appears well-developed and well-nourished.  HENT:  Nose: Nose normal.  Mouth/Throat: Oropharynx is clear and moist.  Eyes: EOM are normal.  Neck: Trachea normal, normal range of motion and full passive range of motion without pain. Neck supple. No JVD present. Carotid bruit is not present. No thyromegaly present.  Cardiovascular: Normal rate, regular rhythm, normal heart sounds and intact distal pulses. Exam reveals no gallop and no friction rub.  No murmur heard. Pulmonary/Chest: Effort normal and breath sounds normal.  Abdominal: Soft. Bowel sounds are normal. She exhibits no distension and no mass. There is no tenderness.  Musculoskeletal: Normal range of motion.  Lymphadenopathy:    She has no cervical adenopathy.  Neurological: She is alert and oriented to person, place, and time. She has normal reflexes.  Skin: Skin is warm and dry.  Psychiatric: She has a normal mood and affect. Her behavior is normal. Judgment and thought content normal.    BP (!) 145/84   Pulse (!) 102   Temp 98.1 F (36.7 C) (Oral)   Ht  5\' 5"  (1.651 m)   Wt 195 lb (88.5 kg)   BMI 32.45 kg/m       Assessment & Plan:  1. Essential hypertension, benign Low sodium diet - lisinopril (PRINIVIL,ZESTRIL) 10 MG tablet; Take 1 tablet (10 mg total) by mouth daily.  Dispense: 90 tablet; Refill: 0  2. Gastroesophageal reflux disease without esophagitis Avoid spicy foods Do not eat 2 hours prior to bedtime  3. Recurrent major depressive disorder, in full remission ([place]) Stress management - sertraline  (ZOLOFT) 100 MG tablet; 1  po qd  Dispense: 90 tablet; Refill: 1 - ALPRAZolam (XANAX) 0.5 MG tablet; Take 1 tablet (0.5 mg total) by mouth 3 (three) times daily.  Dispense: 90 tablet; Refill: 0  4. BMI 33.0-33.9,adult Discussed diet and exercise for person with BMI >25 Will recheck weight in 3-6 months  5. Hyperlipidemia with target LDL less than 100 Low fat diet    Labs pending Health maintenance reviewed Diet and exercise encouraged Continue all meds Follow up  In 6 months   [place], FNP

## 2017-02-14 NOTE — Patient Instructions (Signed)
Stress and Stress Management Stress is a normal reaction to life events. It is what you feel when life demands more than you are used to or more than you can handle. Some stress can be useful. For example, the stress reaction can help you catch the last bus of the day, study for a test, or meet a deadline at work. But stress that occurs too often or for too long can cause problems. It can affect your emotional health and interfere with relationships and normal daily activities. Too much stress can weaken your immune system and increase your risk for physical illness. If you already have a medical problem, stress can make it worse. What are the causes? All sorts of life events may cause stress. An event that causes stress for one person may not be stressful for another person. Major life events commonly cause stress. These may be positive or negative. Examples include losing your job, moving into a new home, getting married, having a baby, or losing a loved one. Less obvious life events may also cause stress, especially if they occur day after day or in combination. Examples include working long hours, driving in traffic, caring for children, being in debt, or being in a difficult relationship. What are the signs or symptoms? Stress may cause emotional symptoms including, the following:  Anxiety. This is feeling worried, afraid, on edge, overwhelmed, or out of control.  Anger. This is feeling irritated or impatient.  Depression. This is feeling sad, down, helpless, or guilty.  Difficulty focusing, remembering, or making decisions.  Stress may cause physical symptoms, including the following:  Aches and pains. These may affect your head, neck, back, stomach, or other areas of your body.  Tight muscles or clenched jaw.  Low energy or trouble sleeping.  Stress may cause unhealthy behaviors, including the following:  Eating to feel better (overeating) or skipping meals.  Sleeping too little,  too much, or both.  Working too much or putting off tasks (procrastination).  Smoking, drinking alcohol, or using drugs to feel better.  How is this diagnosed? Stress is diagnosed through an assessment by your health care provider. Your health care provider will ask questions about your symptoms and any stressful life events.Your health care provider will also ask about your medical history and may order blood tests or other tests. Certain medical conditions and medicine can cause physical symptoms similar to stress. Mental illness can cause emotional symptoms and unhealthy behaviors similar to stress. Your health care provider may refer you to a mental health professional for further evaluation. How is this treated? Stress management is the recommended treatment for stress.The goals of stress management are reducing stressful life events and coping with stress in healthy ways. Techniques for reducing stressful life events include the following:  Stress identification. Self-monitor for stress and identify what causes stress for you. These skills may help you to avoid some stressful events.  Time management. Set your priorities, keep a calendar of events, and learn to say "no." These tools can help you avoid making too many commitments.  Techniques for coping with stress include the following:  Rethinking the problem. Try to think realistically about stressful events rather than ignoring them or overreacting. Try to find the positives in a stressful situation rather than focusing on the negatives.  Exercise. Physical exercise can release both physical and emotional tension. The key is to find a form of exercise you enjoy and do it regularly.  Relaxation techniques. These relax the body and  mind. Examples include yoga, meditation, tai chi, biofeedback, deep breathing, progressive muscle relaxation, listening to music, being out in nature, journaling, and other hobbies. Again, the key is to find  one or more that you enjoy and can do regularly.  Healthy lifestyle. Eat a balanced diet, get plenty of sleep, and do not smoke. Avoid using alcohol or drugs to relax.  Strong support network. Spend time with family, friends, or other people you enjoy being around.Express your feelings and talk things over with someone you trust.  Counseling or talktherapy with a mental health professional may be helpful if you are having difficulty managing stress on your own. Medicine is typically not recommended for the treatment of stress.Talk to your health care provider if you think you need medicine for symptoms of stress. Follow these instructions at home:  Keep all follow-up visits as directed by your health care provider.  Take all medicines as directed by your health care provider. Contact a health care provider if:  Your symptoms get worse or you start having new symptoms.  You feel overwhelmed by your problems and can no longer manage them on your own. Get help right away if:  You feel like hurting yourself or someone else. This information is not intended to replace advice given to you by your health care provider. Make sure you discuss any questions you have with your health care provider. Document Released: 06/28/2000 Document Revised: 06/10/2015 Document Reviewed: 08/27/2012 Elsevier Interactive Patient Education  2017 [employer] Inc.  

## 2017-02-15 LAB — LIPID PANEL
CHOL/HDL RATIO: 4 ratio (ref 0.0–4.4)
CHOLESTEROL TOTAL: 257 mg/dL — AB (ref 100–199)
HDL: 65 mg/dL (ref >39)
LDL CALC: 124 mg/dL — AB (ref 0–99)
Triglycerides: 342 mg/dL — ABNORMAL HIGH (ref 0–149)
VLDL CHOLESTEROL CAL: 68 mg/dL — AB (ref 5–40)

## 2017-02-15 LAB — CMP14+EGFR
ALK PHOS: 90 IU/L (ref 39–117)
ALT: 65 IU/L — ABNORMAL HIGH (ref 0–32)
AST: 85 IU/L — AB (ref 0–40)
Albumin/Globulin Ratio: 1.5 (ref 1.2–2.2)
Albumin: 4.9 g/dL (ref 3.5–5.5)
BUN/Creatinine Ratio: 12 (ref 9–23)
BUN: 12 mg/dL (ref 6–24)
Bilirubin Total: 0.3 mg/dL (ref 0.0–1.2)
CALCIUM: 9.8 mg/dL (ref 8.7–10.2)
CO2: 22 mmol/L (ref 20–29)
CREATININE: 1 mg/dL (ref 0.57–1.00)
Chloride: 98 mmol/L (ref 96–106)
GFR calc Af Amer: 75 mL/min/{1.73_m2} (ref >59)
GFR, EST NON AFRICAN AMERICAN: 65 mL/min/{1.73_m2} (ref >59)
GLUCOSE: 105 mg/dL — AB (ref 65–99)
Globulin, Total: 3.2 g/dL (ref 1.5–4.5)
Potassium: 4.6 mmol/L (ref 3.5–5.2)
Sodium: 139 mmol/L (ref 134–144)
Total Protein: 8.1 g/dL (ref 6.0–8.5)

## 2017-03-12 ENCOUNTER — Other Ambulatory Visit: Payer: Self-pay | Admitting: Nurse Practitioner

## 2017-03-12 DIAGNOSIS — F3342 Major depressive disorder, recurrent, in full remission: Secondary | ICD-10-CM

## 2017-03-15 ENCOUNTER — Other Ambulatory Visit: Payer: Self-pay | Admitting: *Deleted

## 2017-03-15 DIAGNOSIS — I10 Essential (primary) hypertension: Secondary | ICD-10-CM

## 2017-03-15 MED ORDER — LISINOPRIL 10 MG PO TABS: 10.0000 mg | ORAL_TABLET | Freq: Every day | ORAL | 1 refills | Status: DC

## 2017-04-21 ENCOUNTER — Other Ambulatory Visit: Payer: Self-pay | Admitting: Nurse Practitioner

## 2017-04-21 DIAGNOSIS — F3342 Major depressive disorder, recurrent, in full remission: Secondary | ICD-10-CM

## 2017-04-23 NOTE — Telephone Encounter (Signed)
Last seen 02/14/17   MMM 

## 2017-05-04 DIAGNOSIS — C181 Malignant neoplasm of appendix: Secondary | ICD-10-CM | POA: Diagnosis not present

## 2017-05-22 ENCOUNTER — Other Ambulatory Visit: Payer: Self-pay | Admitting: Nurse Practitioner

## 2017-05-22 DIAGNOSIS — F3342 Major depressive disorder, recurrent, in full remission: Secondary | ICD-10-CM

## 2017-05-31 ENCOUNTER — Telehealth: Payer: Self-pay | Admitting: Nurse Practitioner

## 2017-05-31 MED ORDER — SCOPOLAMINE 1 MG/3DAYS TD PT72: 1.0000 | MEDICATED_PATCH | TRANSDERMAL | 1 refills | Status: DC

## 2017-05-31 NOTE — Telephone Encounter (Signed)
scoplamine patch rx sent to pharmacy

## 2017-07-10 ENCOUNTER — Other Ambulatory Visit: Payer: Self-pay | Admitting: Nurse Practitioner

## 2017-07-10 DIAGNOSIS — F3342 Major depressive disorder, recurrent, in full remission: Secondary | ICD-10-CM

## 2017-08-20 ENCOUNTER — Other Ambulatory Visit: Payer: Self-pay | Admitting: Nurse Practitioner

## 2017-08-20 DIAGNOSIS — F3342 Major depressive disorder, recurrent, in full remission: Secondary | ICD-10-CM

## 2017-08-21 ENCOUNTER — Other Ambulatory Visit: Payer: Self-pay | Admitting: Nurse Practitioner

## 2017-08-21 DIAGNOSIS — F3342 Major depressive disorder, recurrent, in full remission: Secondary | ICD-10-CM

## 2017-08-21 NOTE — Telephone Encounter (Signed)
What is the name of the medication? XANAX  Have you contacted your pharmacy to request a refill? Yes she was told by pharmacy she needs to be seen has appt on 09/14/2017 wants enough to last her until then  Which pharmacy would you like this sent to? [facility] [address] [place]   Patient notified that their request is being sent to the clinical staff for review and that they should receive a call once it is complete. If they do not receive a call within 24 hours they can check with their pharmacy or our office.

## 2017-08-22 MED ORDER — ALPRAZOLAM 0.5 MG PO TABS: 0.5000 mg | ORAL_TABLET | Freq: Three times a day (TID) | ORAL | 0 refills | Status: DC

## 2017-09-14 ENCOUNTER — Encounter: Payer: Self-pay | Admitting: Nurse Practitioner

## 2017-09-14 ENCOUNTER — Ambulatory Visit: Payer: BLUE CROSS/BLUE SHIELD | Admitting: Nurse Practitioner

## 2017-09-14 VITALS — BP 138/86 | HR 97 | Temp 98.9°F | Ht 65.0 in | Wt 198.0 lb

## 2017-09-14 DIAGNOSIS — Z6833 Body mass index (BMI) 33.0-33.9, adult: Secondary | ICD-10-CM

## 2017-09-14 DIAGNOSIS — E785 Hyperlipidemia, unspecified: Secondary | ICD-10-CM

## 2017-09-14 DIAGNOSIS — I10 Essential (primary) hypertension: Secondary | ICD-10-CM | POA: Diagnosis not present

## 2017-09-14 DIAGNOSIS — F3342 Major depressive disorder, recurrent, in full remission: Secondary | ICD-10-CM | POA: Diagnosis not present

## 2017-09-14 DIAGNOSIS — K219 Gastro-esophageal reflux disease without esophagitis: Secondary | ICD-10-CM | POA: Diagnosis not present

## 2017-09-14 MED ORDER — ALPRAZOLAM 0.5 MG PO TABS: 0.5000 mg | ORAL_TABLET | Freq: Three times a day (TID) | ORAL | 5 refills | Status: DC

## 2017-09-14 MED ORDER — PHENTERMINE-TOPIRAMATE ER 7.5-46 MG PO CP24: 1.0000 | ORAL_CAPSULE | Freq: Every day | ORAL | 5 refills | Status: DC

## 2017-09-14 MED ORDER — LISINOPRIL 10 MG PO TABS: 10.0000 mg | ORAL_TABLET | Freq: Every day | ORAL | 1 refills | Status: DC

## 2017-09-14 MED ORDER — SERTRALINE HCL 100 MG PO TABS: ORAL_TABLET | ORAL | 1 refills | Status: DC

## 2017-09-14 NOTE — Progress Notes (Signed)
Subjective:    Patient ID: Gabriela Mccormick, female    DOB: 1965-09-15, 52 y.o.   MRN: [id number]   Chief Complaint: Medical Management of Chronic Issues   HPI:  1. Essential hypertension, benign No c/o chest , sob or headaches. Does not check blood pressure at home. BP Readings from Last 3 Encounters:  09/14/17 138/86  02/14/17 (!) 145/84  02/16/16 130/84      2. Gastroesophageal reflux disease without esophagitis  Currently not having any issues. Is not taking any meds.  3. Hyperlipidemia with target LDL less than 100  Eats whatever she wants to . Does no exercise.  4. Recurrent major depressive disorder, in full remission ([place])  Is currently on zoloft daily- works well. She is on xanax bid. Works well to keep her calm.  5. BMI 33.0-33.9,adult  Weight is up 3 lbs    Outpatient Encounter Medications as of 09/14/2017  Medication Sig  . ALPRAZolam (XANAX) 0.5 MG tablet Take 1 tablet (0.5 mg total) by mouth 3 (three) times daily.  Marland Kitchen EPINEPHRINE 0.3 mg/0.3 mL IJ SOAJ injection INJECT 0.3 MLS (0.3 MG TOTAL) INTO THE MUSCLE ONCE.  Marland Kitchen lisinopril (PRINIVIL,ZESTRIL) 10 MG tablet Take 1 tablet (10 mg total) by mouth daily.  . sertraline (ZOLOFT) 100 MG tablet 1  po qd  . CONTRAVE 8-90 MG TB12 1TAB BY MOUTH EVERY DAYX7,THEN 1TAB TWICE A DAYX7,THEN 2TAB IN AM AND 1 TAB PMX7,THEN 2TABS 2XDAY (Patient not taking: Reported on 02/14/2017)  . scopolamine (TRANSDERM-SCOP, 1.5 MG,) 1 MG/3DAYS Place 1 patch (1.5 mg total) onto the skin every 3 (three) days. (Patient not taking: Reported on 09/14/2017)       New complaints: - use to be on contrave for diet. It hyped her up and ten started making her sick.  Social history: She just recently went on a cruise and loved it.   Review of Systems  Constitutional: Negative for activity change and appetite change.  HENT: Negative.   Eyes: Negative for pain.  Respiratory: Negative for shortness of breath.   Cardiovascular: Negative for chest  pain, palpitations and leg swelling.  Gastrointestinal: Negative for abdominal pain.  Endocrine: Negative for polydipsia.  Genitourinary: Negative.   Skin: Negative for rash.  Neurological: Negative for dizziness, weakness and headaches.  Hematological: Does not bruise/bleed easily.  Psychiatric/Behavioral: Negative.   All other systems reviewed and are negative.      Objective:   Physical Exam  Constitutional: She is oriented to person, place, and time. She appears well-developed and well-nourished. No distress.  HENT:  Head: Normocephalic.  Nose: Nose normal.  Mouth/Throat: Oropharynx is clear and moist.  Eyes: Pupils are equal, round, and reactive to light. EOM are normal.  Neck: Normal range of motion. Neck supple. No JVD present. Carotid bruit is not present.  Cardiovascular: Normal rate, regular rhythm, normal heart sounds and intact distal pulses.  Pulmonary/Chest: Effort normal and breath sounds normal. No respiratory distress. She has no wheezes. She has no rales. She exhibits no tenderness.  Abdominal: Soft. Normal appearance, normal aorta and bowel sounds are normal. She exhibits no distension, no abdominal bruit, no pulsatile midline mass and no mass. There is no splenomegaly or hepatomegaly. There is no tenderness.  Musculoskeletal: Normal range of motion. She exhibits no edema.  Lymphadenopathy:    She has no cervical adenopathy.  Neurological: She is alert and oriented to person, place, and time. She has normal reflexes.  Skin: Skin is warm and dry.  Psychiatric: She has  a normal mood and affect. Her behavior is normal. Judgment and thought content normal.  Nursing note and vitals reviewed.  BP 138/86   Pulse 97   Temp 98.9 F (37.2 C) (Oral)   Ht '5\' 5"'  (1.651 m)   Wt 198 lb (89.8 kg)   BMI 32.95 kg/m       Assessment & Plan:  Gabriela Mccormick comes in today with chief complaint of Medical Management of Chronic Issues   Diagnosis and orders  addressed:  1. Essential hypertension, benign Low sodium diet - lisinopril (PRINIVIL,ZESTRIL) 10 MG tablet; Take 1 tablet (10 mg total) by mouth daily.  Dispense: 90 tablet; Refill: 1 - CMP14+EGFR  2. Gastroesophageal reflux disease without esophagitis Avoid spicy foods Do not eat 2 hours prior to bedtime  3. Hyperlipidemia with target LDL less than 100 Low fat diet - Lipid panel  4. Recurrent major depressive disorder, in full remission ([place]) - ALPRAZolam (XANAX) 0.5 MG tablet; Take 1 tablet (0.5 mg total) by mouth 3 (three) times daily.  Dispense: 60 tablet; Refill: 5 - sertraline (ZOLOFT) 100 MG tablet; 1  po qd  Dispense: 90 tablet; Refill: 1  5. BMI 33.0-33.9,adult Discussed diet and exercise for person with BMI >25 Will recheck weight in 3-6 months - Phentermine-Topiramate (QSYMIA) 7.5-46 MG CP24; Take 1 tablet by mouth daily.  Dispense: 30 capsule; Refill: 5   Labs pending Health Maintenance reviewed Diet and exercise encouraged  Follow up plan: 3 months   [name], FNP

## 2017-09-14 NOTE — Patient Instructions (Signed)
Exercising to Lose Weight Exercising can help you to lose weight. In order to lose weight through exercise, you need to do vigorous-intensity exercise. You can tell that you are exercising with vigorous intensity if you are breathing very hard and fast and cannot hold a conversation while exercising. Moderate-intensity exercise helps to maintain your current weight. You can tell that you are exercising at a moderate level if you have a higher heart rate and faster breathing, but you are still able to hold a conversation. How often should I exercise? Choose an activity that you enjoy and set realistic goals. Your health care provider can help you to make an activity plan that works for you. Exercise regularly as directed by your health care provider. This may include:  Doing resistance training twice each week, such as: ? Push-ups. ? Sit-ups. ? Lifting weights. ? Using resistance bands.  Doing a given intensity of exercise for a given amount of time. Choose from these options: ? 150 minutes of moderate-intensity exercise every week. ? 75 minutes of vigorous-intensity exercise every week. ? A mix of moderate-intensity and vigorous-intensity exercise every week.  Children, pregnant women, people who are out of shape, people who are overweight, and older adults may need to consult a health care provider for individual recommendations. If you have any sort of medical condition, be sure to consult your health care provider before starting a new exercise program. What are some activities that can help me to lose weight?  Walking at a rate of at least 4.5 miles an hour.  Jogging or running at a rate of 5 miles per hour.  Biking at a rate of at least 10 miles per hour.  Lap swimming.  Roller-skating or in-line skating.  Cross-country skiing.  Vigorous competitive sports, such as football, basketball, and soccer.  Jumping rope.  Aerobic dancing. How can I be more active in my day-to-day  activities?  Use the stairs instead of the elevator.  Take a walk during your lunch break.  If you drive, park your car farther away from work or school.  If you take public transportation, get off one stop early and walk the rest of the way.  Make all of your phone calls while standing up and walking around.  Get up, stretch, and walk around every 30 minutes throughout the day. What guidelines should I follow while exercising?  Do not exercise so much that you hurt yourself, feel dizzy, or get very short of breath.  Consult your health care provider prior to starting a new exercise program.  Wear comfortable clothes and shoes with good support.  Drink plenty of water while you exercise to prevent dehydration or heat stroke. Body water is lost during exercise and must be replaced.  Work out until you breathe faster and your heart beats faster. This information is not intended to replace advice given to you by your health care provider. Make sure you discuss any questions you have with your health care provider. Document Released: 02/04/2010 Document Revised: 06/10/2015 Document Reviewed: 06/05/2013 Elsevier Interactive Patient Education  2018 Elsevier Inc.  

## 2017-11-02 DIAGNOSIS — C181 Malignant neoplasm of appendix: Secondary | ICD-10-CM | POA: Diagnosis not present

## 2017-11-02 DIAGNOSIS — K388 Other specified diseases of appendix: Secondary | ICD-10-CM | POA: Diagnosis not present

## 2017-11-02 DIAGNOSIS — Z9089 Acquired absence of other organs: Secondary | ICD-10-CM | POA: Diagnosis not present

## 2017-11-02 DIAGNOSIS — Z48815 Encounter for surgical aftercare following surgery on the digestive system: Secondary | ICD-10-CM | POA: Diagnosis not present

## 2017-12-10 ENCOUNTER — Other Ambulatory Visit: Payer: Self-pay | Admitting: Nurse Practitioner

## 2017-12-10 MED ORDER — SCOPOLAMINE 1 MG/3DAYS TD PT72: 1.0000 | MEDICATED_PATCH | TRANSDERMAL | 1 refills | Status: DC

## 2017-12-10 NOTE — Telephone Encounter (Signed)
Rx sent over per MMM and pt is aware that rx sent.

## 2018-01-02 ENCOUNTER — Telehealth: Payer: Self-pay | Admitting: Nurse Practitioner

## 2018-01-02 DIAGNOSIS — F3342 Major depressive disorder, recurrent, in full remission: Secondary | ICD-10-CM

## 2018-01-02 NOTE — Telephone Encounter (Signed)
Sig TID Rx on 09/14/17 #60 with 5 RFs Please Advise

## 2018-01-03 ENCOUNTER — Telehealth: Payer: Self-pay | Admitting: Nurse Practitioner

## 2018-01-03 MED ORDER — ALPRAZOLAM 0.5 MG PO TABS: 0.5000 mg | ORAL_TABLET | Freq: Three times a day (TID) | ORAL | 2 refills | Status: DC

## 2018-01-03 NOTE — Telephone Encounter (Signed)
Resent to MMM

## 2018-01-03 NOTE — Telephone Encounter (Signed)
PT has called back from message from yesterday, states she has never heard anything back, please advise

## 2018-01-04 ENCOUNTER — Telehealth: Payer: Self-pay | Admitting: Nurse Practitioner

## 2018-01-04 NOTE — Telephone Encounter (Signed)
Med sent to pharmacy. Patient notified 

## 2018-01-11 ENCOUNTER — Encounter: Payer: Self-pay | Admitting: Nurse Practitioner

## 2018-01-11 ENCOUNTER — Ambulatory Visit: Payer: BLUE CROSS/BLUE SHIELD | Admitting: Nurse Practitioner

## 2018-01-11 VITALS — BP 117/83 | HR 94 | Temp 98.8°F | Ht 65.0 in | Wt 191.0 lb

## 2018-01-11 DIAGNOSIS — E785 Hyperlipidemia, unspecified: Secondary | ICD-10-CM | POA: Diagnosis not present

## 2018-01-11 DIAGNOSIS — K219 Gastro-esophageal reflux disease without esophagitis: Secondary | ICD-10-CM

## 2018-01-11 DIAGNOSIS — Z6833 Body mass index (BMI) 33.0-33.9, adult: Secondary | ICD-10-CM

## 2018-01-11 DIAGNOSIS — I10 Essential (primary) hypertension: Secondary | ICD-10-CM | POA: Diagnosis not present

## 2018-01-11 DIAGNOSIS — F3342 Major depressive disorder, recurrent, in full remission: Secondary | ICD-10-CM | POA: Diagnosis not present

## 2018-01-11 MED ORDER — LISINOPRIL 10 MG PO TABS: 10.0000 mg | ORAL_TABLET | Freq: Every day | ORAL | 1 refills | Status: DC

## 2018-01-11 MED ORDER — ALPRAZOLAM 0.5 MG PO TABS: 0.5000 mg | ORAL_TABLET | Freq: Three times a day (TID) | ORAL | 2 refills | Status: DC

## 2018-01-11 MED ORDER — SERTRALINE HCL 100 MG PO TABS: ORAL_TABLET | ORAL | 1 refills | Status: DC

## 2018-01-11 NOTE — Patient Instructions (Signed)

## 2018-01-11 NOTE — Progress Notes (Signed)
Subjective:    Patient ID: Gabriela Mccormick, female    DOB: [birth year], 52 y.o.   MRN: [id number]   Chief Complaint: medical management of chronic issues  HPI:  1. Essential hypertension, benign  No c/o chest pain, sob or headache. Does not check blood pressure at home. BP Readings from Last 3 Encounters:  09/14/17 138/86  02/14/17 (!) 145/84  02/16/16 130/84     2. Gastroesophageal reflux disease without esophagitis  Just uses OTC meds as needed.   3. Hyperlipidemia with target LDL less than 100  Has been trying to watch diet. Not doing much exercise  4. Recurrent major depressive disorder, in full remission ([place])  Is on combination of zoloft daily and xanax 3x a day. cmbination works well for her. We have tried weaning her down to 2 xanax a day but she could not tolerate.  5. BMI 33.0-33.9,adult  She was put on qysmia at last visit. She is down 10lbs from previous visit.    Outpatient Encounter Medications as of 01/11/2018  Medication Sig  . ALPRAZolam (XANAX) 0.5 MG tablet Take 1 tablet (0.5 mg total) by mouth 3 (three) times daily.  Marland Kitchen EPINEPHRINE 0.3 mg/0.3 mL IJ SOAJ injection INJECT 0.3 MLS (0.3 MG TOTAL) INTO THE MUSCLE ONCE.  Marland Kitchen lisinopril (PRINIVIL,ZESTRIL) 10 MG tablet Take 1 tablet (10 mg total) by mouth daily.  . Phentermine-Topiramate (QSYMIA) 7.5-46 MG CP24 Take 1 tablet by mouth daily.  Marland Kitchen scopolamine (TRANSDERM-SCOP, 1.5 MG,) 1 MG/3DAYS Place 1 patch (1.5 mg total) onto the skin every 3 (three) days.  Marland Kitchen sertraline (ZOLOFT) 100 MG tablet 1  po qd      New complaints: None today  Social history: Just got back from a cruis to [place]  Review of Systems  Constitutional: Negative for activity change and appetite change.  HENT: Negative.   Eyes: Negative for pain.  Respiratory: Negative for shortness of breath.   Cardiovascular: Negative for chest pain, palpitations and leg swelling.  Gastrointestinal: Negative for abdominal pain.  Endocrine:  Negative for polydipsia.  Genitourinary: Negative.   Skin: Negative for rash.  Neurological: Negative for dizziness, weakness and headaches.  Hematological: Does not bruise/bleed easily.  Psychiatric/Behavioral: Negative.   All other systems reviewed and are negative.      Objective:   Physical Exam Vitals signs and nursing note reviewed.  Constitutional:      General: She is not in acute distress.    Appearance: Normal appearance. She is well-developed.  HENT:     Head: Normocephalic.     Nose: Nose normal.  Eyes:     Pupils: Pupils are equal, round, and reactive to light.  Neck:     Musculoskeletal: Normal range of motion and neck supple.     Vascular: No carotid bruit or JVD.  Cardiovascular:     Rate and Rhythm: Normal rate and regular rhythm.     Heart sounds: Normal heart sounds.  Pulmonary:     Effort: Pulmonary effort is normal. No respiratory distress.     Breath sounds: Normal breath sounds. No wheezing or rales.  Chest:     Chest wall: No tenderness.  Abdominal:     General: Bowel sounds are normal. There is no distension or abdominal bruit.     Palpations: Abdomen is soft. There is no hepatomegaly, splenomegaly, mass or pulsatile mass.     Tenderness: There is no abdominal tenderness.  Musculoskeletal: Normal range of motion.  Lymphadenopathy:     Cervical: No  cervical adenopathy.  Skin:    General: Skin is warm and dry.  Neurological:     Mental Status: She is alert and oriented to person, place, and time.     Deep Tendon Reflexes: Reflexes are normal and symmetric.  Psychiatric:        Behavior: Behavior normal.        Thought Content: Thought content normal.        Judgment: Judgment normal.    BP 117/83   Pulse 94   Temp 98.8 F (37.1 C) (Oral)   Ht 5\' 5"  (1.651 m)   Wt 191 lb (86.6 kg)   BMI 31.78 kg/m         Assessment & Plan:  Gabriela Mccormick comes in today with chief complaint of Medical Management of Chronic Issues   Diagnosis  and orders addressed:  1. Essential hypertension, benign Low sodium diet - lisinopril (PRINIVIL,ZESTRIL) 10 MG tablet; Take 1 tablet (10 mg total) by mouth daily.  Dispense: 90 tablet; Refill: 1  2. Gastroesophageal reflux disease without esophagitis Avoid spicy foods Do not eat 2 hours prior to bedtime  3. Hyperlipidemia with target LDL less than 100 Low fat diet  4. Recurrent major depressive disorder, in full remission (Gabriela Mccormick) Stress management - ALPRAZolam (XANAX) 0.5 MG tablet; Take 1 tablet (0.5 mg total) by mouth 3 (three) times daily.  Dispense: 90 tablet; Refill: 2 - sertraline (ZOLOFT) 100 MG tablet; 1  po qd  Dispense: 90 tablet; Refill: 1  5. BMI 33.0-33.9,adult Discussed diet and exercise for person with BMI >25 Will recheck weight in 3-6 months    Labs pending Health Maintenance reviewed Diet and exercise encouraged  Follow up plan: 6 months   [name], FNP

## 2018-01-11 NOTE — Addendum Note (Signed)
Addended by: Chevis Pretty on: 01/11/2018 11:54 AM   Modules accepted: Orders

## 2018-01-12 LAB — CMP14+EGFR
ALT: 67 IU/L — AB (ref 0–32)
AST: 67 IU/L — ABNORMAL HIGH (ref 0–40)
Albumin/Globulin Ratio: 1.6 (ref 1.2–2.2)
Albumin: 4.4 g/dL (ref 3.5–5.5)
Alkaline Phosphatase: 98 IU/L (ref 39–117)
BUN/Creatinine Ratio: 11 (ref 9–23)
BUN: 7 mg/dL (ref 6–24)
Bilirubin Total: 0.4 mg/dL (ref 0.0–1.2)
CO2: 21 mmol/L (ref 20–29)
Calcium: 9.1 mg/dL (ref 8.7–10.2)
Chloride: 104 mmol/L (ref 96–106)
Creatinine, Ser: 0.66 mg/dL (ref 0.57–1.00)
GFR calc Af Amer: 117 mL/min/{1.73_m2} (ref >59)
GFR calc non Af Amer: 102 mL/min/{1.73_m2} (ref >59)
GLUCOSE: 87 mg/dL (ref 65–99)
Globulin, Total: 2.8 g/dL (ref 1.5–4.5)
Potassium: 4.9 mmol/L (ref 3.5–5.2)
Sodium: 141 mmol/L (ref 134–144)
Total Protein: 7.2 g/dL (ref 6.0–8.5)

## 2018-01-12 LAB — LIPID PANEL
Chol/HDL Ratio: 3.4 ratio (ref 0.0–4.4)
Cholesterol, Total: 195 mg/dL (ref 100–199)
HDL: 57 mg/dL (ref >39)
LDL Calculated: 98 mg/dL (ref 0–99)
Triglycerides: 200 mg/dL — ABNORMAL HIGH (ref 0–149)
VLDL Cholesterol Cal: 40 mg/dL (ref 5–40)

## 2018-01-16 DIAGNOSIS — C801 Malignant (primary) neoplasm, unspecified: Secondary | ICD-10-CM

## 2018-01-16 HISTORY — DX: Malignant (primary) neoplasm, unspecified: C80.1

## 2018-01-22 ENCOUNTER — Encounter: Payer: Self-pay | Admitting: *Deleted

## 2018-02-01 DIAGNOSIS — Z9049 Acquired absence of other specified parts of digestive tract: Secondary | ICD-10-CM | POA: Diagnosis not present

## 2018-02-01 DIAGNOSIS — C181 Malignant neoplasm of appendix: Secondary | ICD-10-CM | POA: Diagnosis not present

## 2018-02-01 DIAGNOSIS — R160 Hepatomegaly, not elsewhere classified: Secondary | ICD-10-CM | POA: Diagnosis not present

## 2018-02-18 DIAGNOSIS — I10 Essential (primary) hypertension: Secondary | ICD-10-CM | POA: Diagnosis not present

## 2018-02-18 DIAGNOSIS — C181 Malignant neoplasm of appendix: Secondary | ICD-10-CM | POA: Diagnosis not present

## 2018-02-18 DIAGNOSIS — K668 Other specified disorders of peritoneum: Secondary | ICD-10-CM | POA: Diagnosis not present

## 2018-02-18 DIAGNOSIS — R188 Other ascites: Secondary | ICD-10-CM | POA: Diagnosis not present

## 2018-03-11 DIAGNOSIS — L709 Acne, unspecified: Secondary | ICD-10-CM | POA: Diagnosis not present

## 2018-03-11 DIAGNOSIS — L814 Other melanin hyperpigmentation: Secondary | ICD-10-CM | POA: Diagnosis not present

## 2018-03-29 DIAGNOSIS — C181 Malignant neoplasm of appendix: Secondary | ICD-10-CM | POA: Diagnosis not present

## 2018-04-01 ENCOUNTER — Other Ambulatory Visit: Payer: Self-pay | Admitting: Nurse Practitioner

## 2018-04-01 DIAGNOSIS — Z6833 Body mass index (BMI) 33.0-33.9, adult: Secondary | ICD-10-CM

## 2018-04-18 ENCOUNTER — Other Ambulatory Visit: Payer: Self-pay | Admitting: Nurse Practitioner

## 2018-04-18 DIAGNOSIS — F3342 Major depressive disorder, recurrent, in full remission: Secondary | ICD-10-CM

## 2018-05-17 DIAGNOSIS — C181 Malignant neoplasm of appendix: Secondary | ICD-10-CM | POA: Diagnosis not present

## 2018-05-17 DIAGNOSIS — Z1159 Encounter for screening for other viral diseases: Secondary | ICD-10-CM | POA: Diagnosis not present

## 2018-05-19 DIAGNOSIS — R000 Tachycardia, unspecified: Secondary | ICD-10-CM | POA: Diagnosis not present

## 2018-05-27 DIAGNOSIS — K668 Other specified disorders of peritoneum: Secondary | ICD-10-CM | POA: Diagnosis not present

## 2018-05-27 DIAGNOSIS — G8918 Other acute postprocedural pain: Secondary | ICD-10-CM | POA: Diagnosis not present

## 2018-05-27 DIAGNOSIS — Z466 Encounter for fitting and adjustment of urinary device: Secondary | ICD-10-CM | POA: Diagnosis not present

## 2018-05-27 DIAGNOSIS — N838 Other noninflammatory disorders of ovary, fallopian tube and broad ligament: Secondary | ICD-10-CM | POA: Diagnosis not present

## 2018-05-27 DIAGNOSIS — C786 Secondary malignant neoplasm of retroperitoneum and peritoneum: Secondary | ICD-10-CM | POA: Diagnosis not present

## 2018-05-27 DIAGNOSIS — Z79899 Other long term (current) drug therapy: Secondary | ICD-10-CM | POA: Diagnosis not present

## 2018-05-27 DIAGNOSIS — Z90710 Acquired absence of both cervix and uterus: Secondary | ICD-10-CM | POA: Diagnosis not present

## 2018-05-27 DIAGNOSIS — F419 Anxiety disorder, unspecified: Secondary | ICD-10-CM | POA: Diagnosis not present

## 2018-05-27 DIAGNOSIS — N83292 Other ovarian cyst, left side: Secondary | ICD-10-CM | POA: Diagnosis not present

## 2018-05-27 DIAGNOSIS — C481 Malignant neoplasm of specified parts of peritoneum: Secondary | ICD-10-CM | POA: Diagnosis not present

## 2018-05-27 DIAGNOSIS — I10 Essential (primary) hypertension: Secondary | ICD-10-CM | POA: Diagnosis not present

## 2018-05-27 DIAGNOSIS — C181 Malignant neoplasm of appendix: Secondary | ICD-10-CM | POA: Diagnosis not present

## 2018-05-27 DIAGNOSIS — N83291 Other ovarian cyst, right side: Secondary | ICD-10-CM | POA: Diagnosis not present

## 2018-05-27 HISTORY — PX: ABDOMINAL SURGERY: SHX537

## 2018-05-28 DIAGNOSIS — C181 Malignant neoplasm of appendix: Secondary | ICD-10-CM | POA: Diagnosis not present

## 2018-05-28 DIAGNOSIS — G8918 Other acute postprocedural pain: Secondary | ICD-10-CM | POA: Diagnosis not present

## 2018-06-12 DIAGNOSIS — L500 Allergic urticaria: Secondary | ICD-10-CM | POA: Diagnosis not present

## 2018-06-20 ENCOUNTER — Telehealth: Payer: Self-pay | Admitting: Nurse Practitioner

## 2018-07-11 ENCOUNTER — Other Ambulatory Visit: Payer: Self-pay

## 2018-07-11 ENCOUNTER — Ambulatory Visit (INDEPENDENT_AMBULATORY_CARE_PROVIDER_SITE_OTHER): Payer: BC Managed Care – PPO | Admitting: Family Medicine

## 2018-07-11 ENCOUNTER — Encounter: Payer: Self-pay | Admitting: Family Medicine

## 2018-07-11 DIAGNOSIS — L239 Allergic contact dermatitis, unspecified cause: Secondary | ICD-10-CM

## 2018-07-11 MED ORDER — FAMOTIDINE 20 MG PO TABS: 20.0000 mg | ORAL_TABLET | Freq: Two times a day (BID) | ORAL | 0 refills | Status: DC

## 2018-07-11 MED ORDER — PREDNISONE 10 MG (21) PO TBPK: ORAL_TABLET | ORAL | 0 refills | Status: DC

## 2018-07-11 NOTE — Progress Notes (Signed)
Virtual Visit via telephone Note Due to COVID-19, visit is conducted virtually and was requested by patient. This visit type was conducted due to national recommendations for restrictions regarding the COVID-19 Pandemic (e.g. social distancing) in an effort to limit this patient's exposure and mitigate transmission in our community. All issues noted in this document were discussed and addressed.  A physical exam was not performed with this format.   I connected with Gabriela Mccormick on 07/11/18 at 1050 by telephone and verified that I am speaking with the correct person using two identifiers. Gabriela Mccormick is currently located at home and no one is currently with them during visit. The provider, Monia Pouch, FNP is located in their office at time of visit.  I discussed the limitations, risks, security and privacy concerns of performing an evaluation and management service by telephone and the availability of in person appointments. I also discussed with the patient that there may be a patient responsible charge related to this service. The patient expressed understanding and agreed to proceed.  Subjective:  Patient ID: Gabriela Mccormick, female    DOB: May 09, 1965, 53 y.o.   MRN: [id number]  Chief Complaint:  Allergic Reaction   HPI: Gabriela Mccormick is a 53 y.o. female presenting on 07/11/2018 for Allergic Reaction   Pt reports swelling to her nose and cheeks. She reports she was at the beach this past weekend and used new moisturizer on her face. States around 2-3 hours after using the lotion she noticed swelling to the bridge of her nose. States on Tuesday she started having swelling to her cheeks. States she has been taking benadryl with minimal relief of symptoms she denies hives. States she has slight erythema. No throat, eye, tongue, or lip swelling. No trouble speaking or swallowing. No pruritis. No shortness of breath, palpitations, dyspnea, or syncope.   Allergic Reaction This is a  new problem. The current episode started 5 to 7 days ago. The problem occurs constantly. The problem is unchanged. The problem is moderate. Associated with: new lotion. The time of exposure was just prior to onset. Associated symptoms include a rash. Pertinent negatives include no abdominal pain, chest pain, chest pressure, coughing, diarrhea, difficulty breathing, drooling, eye itching, eye redness, eye watering, globus sensation, hyperventilation, itching, stridor, trouble swallowing, vomiting or wheezing. Swelling is present on the face. Past treatments include diphenhydramine. The treatment provided no relief.     Relevant past medical, surgical, family, and social history reviewed and updated as indicated.  Allergies and medications reviewed and updated.   Past Medical History:  Diagnosis Date  . Anxiety   . Hypertension     Past Surgical History:  Procedure Laterality Date  . ABDOMINAL HYSTERECTOMY      Social History   Socioeconomic History  . Marital status: Married    Spouse name: Not on file  . Number of children: Not on file  . Years of education: Not on file  . Highest education level: Not on file  Occupational History  . Not on file  Social Needs  . Financial resource strain: Not on file  . Food insecurity    Worry: Not on file    Inability: Not on file  . Transportation needs    Medical: Not on file    Non-medical: Not on file  Tobacco Use  . Smoking status: Never Smoker  . Smokeless tobacco: Never Used  Substance and Sexual Activity  . Alcohol use: No  . Drug use: No  . Sexual activity:  Not on file  Lifestyle  . Physical activity    Days per week: Not on file    Minutes per session: Not on file  . Stress: Not on file  Relationships  . Social Herbalist on phone: Not on file    Gets together: Not on file    Attends religious service: Not on file    Active member of club or organization: Not on file    Attends meetings of clubs or  organizations: Not on file    Relationship status: Not on file  . Intimate partner violence    Fear of current or ex partner: Not on file    Emotionally abused: Not on file    Physically abused: Not on file    Forced sexual activity: Not on file  Other Topics Concern  . Not on file  Social History Narrative  . Not on file    Outpatient Encounter Medications as of 07/11/2018  Medication Sig  . ALPRAZolam (XANAX) 0.5 MG tablet TAKE 1 TABLET (0.5 MG TOTAL) BY MOUTH 3 (THREE) TIMES DAILY.  Marland Kitchen EPINEPHRINE 0.3 mg/0.3 mL IJ SOAJ injection INJECT 0.3 MLS (0.3 MG TOTAL) INTO THE MUSCLE ONCE.  . famotidine (PEPCID) 20 MG tablet Take 1 tablet (20 mg total) by mouth 2 (two) times daily for 14 days.  Marland Kitchen lisinopril (PRINIVIL,ZESTRIL) 10 MG tablet Take 1 tablet (10 mg total) by mouth daily.  . predniSONE (STERAPRED UNI-PAK 21 TAB) 10 MG (21) TBPK tablet As directed x 6 days  . QSYMIA 7.5-46 MG CP24 TAKE 1 CAPSULE EVERY DAY  . sertraline (ZOLOFT) 100 MG tablet 1  po qd   No facility-administered encounter medications on file as of 07/11/2018.     Allergies  Allergen Reactions  . Bee Venom     Review of Systems  Constitutional: Negative for chills, fatigue and fever.  HENT: Positive for facial swelling. Negative for drooling, ear discharge, ear pain, trouble swallowing and voice change.   Eyes: Negative for redness and itching.  Respiratory: Negative for apnea, cough, choking, chest tightness, shortness of breath, wheezing and stridor.   Cardiovascular: Negative for chest pain and palpitations.  Gastrointestinal: Negative for abdominal pain, diarrhea and vomiting.  Skin: Positive for rash. Negative for itching.  Neurological: Negative for dizziness, syncope, weakness, light-headedness and headaches.  Psychiatric/Behavioral: Negative for confusion.  All other systems reviewed and are negative.        Observations/Objective: No vital signs or physical exam, this was a telephone or virtual  health encounter.  Pt alert and oriented, answers all questions appropriately, and able to speak in full sentences.    Assessment and Plan: Brynnan was seen today for allergic reaction.  Diagnoses and all orders for this visit:  Contact allergic reaction Allergic reaction likely due to new moisturizer. No angioedema or respiratory distress. Avoid trigger. Continue benadryl and Claritin. Will add below. Report any new or worsening symptoms. Pt aware of symptoms that require emergent evaluation and treatment.  -     predniSONE (STERAPRED UNI-PAK 21 TAB) 10 MG (21) TBPK tablet; As directed x 6 days -     famotidine (PEPCID) 20 MG tablet; Take 1 tablet (20 mg total) by mouth 2 (two) times daily for 14 days.     Follow Up Instructions: Return if symptoms worsen or fail to improve.    I discussed the assessment and treatment plan with the patient. The patient was provided an opportunity to ask questions and all were  answered. The patient agreed with the plan and demonstrated an understanding of the instructions.   The patient was advised to call back or seek an in-person evaluation if the symptoms worsen or if the condition fails to improve as anticipated.  The above assessment and management plan was discussed with the patient. The patient verbalized understanding of and has agreed to the management plan. Patient is aware to call the clinic if symptoms persist or worsen. Patient is aware when to return to the clinic for a follow-up visit. Patient educated on when it is appropriate to go to the emergency department.    I provided 15 minutes of non-face-to-face time during this encounter. The call started at 1050. The call ended at 1100. The other time was used for coordination of care.    Monia Pouch, FNP-C [facility] [address] [place], [place] [address] [phone]

## 2018-07-19 ENCOUNTER — Other Ambulatory Visit: Payer: Self-pay | Admitting: Nurse Practitioner

## 2018-07-19 DIAGNOSIS — F3342 Major depressive disorder, recurrent, in full remission: Secondary | ICD-10-CM

## 2018-07-19 MED ORDER — ALPRAZOLAM 0.5 MG PO TABS: 0.5000 mg | ORAL_TABLET | Freq: Three times a day (TID) | ORAL | 0 refills | Status: DC

## 2018-07-19 NOTE — Telephone Encounter (Signed)
nax refilled- NTBS

## 2018-07-19 NOTE — Telephone Encounter (Signed)
Patient aware.

## 2018-08-20 ENCOUNTER — Other Ambulatory Visit: Payer: Self-pay

## 2018-08-20 ENCOUNTER — Ambulatory Visit: Payer: BC Managed Care – PPO | Admitting: Nurse Practitioner

## 2018-08-20 ENCOUNTER — Encounter: Payer: Self-pay | Admitting: Nurse Practitioner

## 2018-08-20 VITALS — BP 120/85 | HR 100 | Temp 97.2°F | Ht 65.0 in | Wt 181.4 lb

## 2018-08-20 DIAGNOSIS — E785 Hyperlipidemia, unspecified: Secondary | ICD-10-CM | POA: Diagnosis not present

## 2018-08-20 DIAGNOSIS — K219 Gastro-esophageal reflux disease without esophagitis: Secondary | ICD-10-CM | POA: Diagnosis not present

## 2018-08-20 DIAGNOSIS — F3342 Major depressive disorder, recurrent, in full remission: Secondary | ICD-10-CM

## 2018-08-20 DIAGNOSIS — Z6830 Body mass index (BMI) 30.0-30.9, adult: Secondary | ICD-10-CM

## 2018-08-20 DIAGNOSIS — I10 Essential (primary) hypertension: Secondary | ICD-10-CM

## 2018-08-20 MED ORDER — QSYMIA 7.5-46 MG PO CP24: 1.0000 | ORAL_CAPSULE | Freq: Every day | ORAL | 2 refills | Status: DC

## 2018-08-20 MED ORDER — ALPRAZOLAM 0.5 MG PO TABS: 0.5000 mg | ORAL_TABLET | Freq: Three times a day (TID) | ORAL | 2 refills | Status: DC

## 2018-08-20 MED ORDER — SERTRALINE HCL 100 MG PO TABS: ORAL_TABLET | ORAL | 1 refills | Status: DC

## 2018-08-20 MED ORDER — LISINOPRIL 10 MG PO TABS: 10.0000 mg | ORAL_TABLET | Freq: Every day | ORAL | 1 refills | Status: DC

## 2018-08-20 NOTE — Patient Instructions (Signed)

## 2018-08-20 NOTE — Progress Notes (Signed)
Subjective:    Patient ID: Gabriela Mccormick, female    DOB: January 10, 1966, 53 y.o.   MRN: [id number]   Chief Complaint: Medical Management of Chronic Issues    HPI:  1. Essential hypertension, benign No c/o chest pain, sob or headache. Does not check blood pressure at home. BP Readings from Last 3 Encounters:  01/11/18 117/83  09/14/17 138/86  02/14/17 (!) 145/84     2. Hyperlipidemia with target LDL less than 100 Does not watch diet and does very little exercise.  3. Gastroesophageal reflux disease without esophagitis Takes pepcid when needed- works well for her.  4. Recurrent major depressive disorder, in full remission ([place]) Is on zoloft daily and takes xanax 0.5mg  TID- we have tried cutting her back on this and were unsuccessful. Depression screen [facility] 2/9 01/11/2018 09/14/2017 02/14/2017  Decreased Interest 0 0 0  Down, Depressed, Hopeless 0 0 0  PHQ - 2 Score 0 0 0    5. BMI 33.0-33.9,adult Weight is down almost 10lbs from last visit    Outpatient Encounter Medications as of 08/20/2018  Medication Sig  . ALPRAZolam (XANAX) 0.5 MG tablet Take 1 tablet (0.5 mg total) by mouth 3 (three) times daily.  Marland Kitchen EPINEPHRINE 0.3 mg/0.3 mL IJ SOAJ injection INJECT 0.3 MLS (0.3 MG TOTAL) INTO THE MUSCLE ONCE.  . famotidine (PEPCID) 20 MG tablet Take 1 tablet (20 mg total) by mouth 2 (two) times daily for 14 days.  Marland Kitchen lisinopril (PRINIVIL,ZESTRIL) 10 MG tablet Take 1 tablet (10 mg total) by mouth daily.  Marland Kitchen QSYMIA 7.5-46 MG CP24 TAKE 1 CAPSULE EVERY DAY  . sertraline (ZOLOFT) 100 MG tablet 1  po qd     Past Surgical History:  Procedure Laterality Date  . 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 She had surgery in may and is doing much better. She has been told that she is cancer free.  Social history: Lives with her husband  Controlled substance contract: 08/20/18    Review of Systems  Constitutional:  Negative for activity change and appetite change.  HENT: Negative.   Eyes: Negative for pain.  Respiratory: Negative for shortness of breath.   Cardiovascular: Negative for chest pain, palpitations and leg swelling.  Gastrointestinal: Negative for abdominal pain.  Endocrine: Negative for polydipsia.  Genitourinary: Negative.   Skin: Negative for rash.  Neurological: Negative for dizziness, weakness and headaches.  Hematological: Does not bruise/bleed easily.  Psychiatric/Behavioral: Negative.   All other systems reviewed and are negative.      Objective:   Physical Exam Vitals signs and nursing note reviewed.  Constitutional:      General: She is not in acute distress.    Appearance: Normal appearance. She is well-developed.  HENT:     Head: Normocephalic.     Nose: Nose normal.  Eyes:     Pupils: Pupils are equal, round, and reactive to light.  Neck:     Musculoskeletal: Normal range of motion and neck supple.     Vascular: No carotid bruit or JVD.  Cardiovascular:     Rate and Rhythm: Normal rate and regular rhythm.     Heart sounds: Normal heart sounds.  Pulmonary:     Effort: Pulmonary effort is normal. No respiratory distress.     Breath sounds: Normal breath sounds. No wheezing or rales.  Chest:     Chest wall: No tenderness.  Abdominal:  General: Bowel sounds are normal. There is no distension or abdominal bruit.     Palpations: Abdomen is soft. There is no hepatomegaly, splenomegaly, mass or pulsatile mass.     Tenderness: There is no abdominal tenderness.  Musculoskeletal: Normal range of motion.  Lymphadenopathy:     Cervical: No cervical adenopathy.  Skin:    General: Skin is warm and dry.  Neurological:     Mental Status: She is alert and oriented to person, place, and time.     Deep Tendon Reflexes: Reflexes are normal and symmetric.  Psychiatric:        Behavior: Behavior normal.        Thought Content: Thought content normal.        Judgment:  Judgment normal.     BP 120/85   Pulse 100   Temp (!) 97.2 F (36.2 C) (Oral)   Ht 5\' 5"  (1.651 m)   Wt 181 lb 6.4 oz (82.3 kg)   BMI 30.19 kg/m          Assessment & Plan:  Gabriela Mccormick comes in today with chief complaint of Medical Management of Chronic Issues   Diagnosis and orders addressed:  1. Essential hypertension, benign Low sodium diet - lisinopril (ZESTRIL) 10 MG tablet; Take 1 tablet (10 mg total) by mouth daily.  Dispense: 90 tablet; Refill: 1  2. Hyperlipidemia with target LDL less than 100 Low fat diet  3. Gastroesophageal reflux disease without esophagitis Avoid spicy foods Do not eat 2 hours prior to bedtime  4. Recurrent major depressive disorder, in full remission ([name]) Stress management - sertraline (ZOLOFT) 100 MG tablet; 1  po qd  Dispense: 90 tablet; Refill: 1 - ALPRAZolam (XANAX) 0.5 MG tablet; Take 1 tablet (0.5 mg total) by mouth 3 (three) times daily.  Dispense: 90 tablet; Refill: 2  5. BMI 30.0-30.9,adult Discussed diet and exercise for person with BMI >25 Will recheck weight in 3-6 months - Phentermine-Topiramate (QSYMIA) 7.5-46 MG CP24; Take 1 tablet by mouth daily.  Dispense: 30 capsule; Refill: 2   Labs pending Health Maintenance reviewed Diet and exercise encouraged  Follow up plan: 6 months   Gabriela Mccormick Done, FNP

## 2018-11-16 ENCOUNTER — Other Ambulatory Visit: Payer: Self-pay | Admitting: Nurse Practitioner

## 2018-11-16 DIAGNOSIS — F3342 Major depressive disorder, recurrent, in full remission: Secondary | ICD-10-CM

## 2018-12-26 ENCOUNTER — Telehealth: Payer: Self-pay | Admitting: *Deleted

## 2018-12-26 NOTE — Telephone Encounter (Signed)
Prior Auth for Qsymia 7.5-46mg -In Process  Key: [id number]  Your information has been submitted to [place]. [employer] [place] will review the request and fax you a determination directly, typically within 3 business days of your submission once all necessary information is received.  If [employer] [place] has not responded in 3 business days or if you have any questions about your submission, contact [place] at [phone].

## 2018-12-27 DIAGNOSIS — K7689 Other specified diseases of liver: Secondary | ICD-10-CM | POA: Diagnosis not present

## 2018-12-27 DIAGNOSIS — Z90722 Acquired absence of ovaries, bilateral: Secondary | ICD-10-CM | POA: Diagnosis not present

## 2018-12-27 DIAGNOSIS — C181 Malignant neoplasm of appendix: Secondary | ICD-10-CM | POA: Diagnosis not present

## 2018-12-27 DIAGNOSIS — Z9889 Other specified postprocedural states: Secondary | ICD-10-CM | POA: Diagnosis not present

## 2018-12-27 DIAGNOSIS — Z8719 Personal history of other diseases of the digestive system: Secondary | ICD-10-CM | POA: Diagnosis not present

## 2018-12-27 NOTE — Telephone Encounter (Signed)
[phone] please call back

## 2018-12-30 NOTE — Telephone Encounter (Signed)
Spoke to the pharmacy and they said the Prior Auth went through and has been approved.

## 2018-12-31 ENCOUNTER — Telehealth: Payer: Self-pay | Admitting: Nurse Practitioner

## 2018-12-31 NOTE — Telephone Encounter (Signed)
Received form, filled out form and signed by provider and faxed back.

## 2019-01-03 NOTE — Telephone Encounter (Signed)
PA for [facility]- APPROVED through 12/25/19  Pharmacy notified

## 2019-01-28 ENCOUNTER — Other Ambulatory Visit: Payer: Self-pay | Admitting: Nurse Practitioner

## 2019-01-28 DIAGNOSIS — Z6830 Body mass index (BMI) 30.0-30.9, adult: Secondary | ICD-10-CM

## 2019-02-05 ENCOUNTER — Other Ambulatory Visit: Payer: Self-pay | Admitting: Nurse Practitioner

## 2019-02-05 NOTE — Telephone Encounter (Signed)
LMOVM Lisinopril was sent in on 08/20/18 #90 with a refill. Instructed to ask for refill by name, it may have gotten a new Rx number when sent in. To contact us back if she is still having issues

## 2019-02-05 NOTE — Telephone Encounter (Signed)
What is the name of the medication? Lisinopril 10 mg Has appt 2-5 with MMM for chronic f/u. Patient has been out for a week  Have you contacted your pharmacy to request a refill? YES  Which pharmacy would you like this sent to? [facility]   Patient notified that their request is being sent to the clinical staff for review and that they should receive a call once it is complete. If they do not receive a call within 24 hours they can check with their pharmacy or our office.

## 2019-02-08 ENCOUNTER — Other Ambulatory Visit: Payer: Self-pay | Admitting: Nurse Practitioner

## 2019-02-08 DIAGNOSIS — I10 Essential (primary) hypertension: Secondary | ICD-10-CM

## 2019-02-10 ENCOUNTER — Telehealth: Payer: Self-pay | Admitting: Nurse Practitioner

## 2019-02-10 NOTE — Telephone Encounter (Signed)
Sent today

## 2019-02-10 NOTE — Telephone Encounter (Signed)
What is the name of the medication? Lisinopril  Have you contacted your pharmacy to request a refill? Yes  Which [facility]? [facility]   Patient notified that their request is being sent to the clinical staff for review and that they should receive a call once it is complete. If they do not receive a call within 24 hours they can check with their pharmacy or our office.   MMM's pt.  She has been out of this med for almost 2 weeks. I told her that it was sent in this morning, but [facility] said it was too soon to refill. Please call pt today.

## 2019-02-20 ENCOUNTER — Telehealth: Payer: Self-pay | Admitting: Nurse Practitioner

## 2019-02-20 ENCOUNTER — Other Ambulatory Visit: Payer: Self-pay

## 2019-02-21 ENCOUNTER — Encounter: Payer: Self-pay | Admitting: Nurse Practitioner

## 2019-02-21 ENCOUNTER — Ambulatory Visit (INDEPENDENT_AMBULATORY_CARE_PROVIDER_SITE_OTHER): Payer: BC Managed Care – PPO | Admitting: Nurse Practitioner

## 2019-02-21 VITALS — BP 142/82 | HR 88 | Temp 98.4°F | Resp 20 | Ht 65.0 in | Wt 192.0 lb

## 2019-02-21 DIAGNOSIS — E785 Hyperlipidemia, unspecified: Secondary | ICD-10-CM

## 2019-02-21 DIAGNOSIS — Z6830 Body mass index (BMI) 30.0-30.9, adult: Secondary | ICD-10-CM

## 2019-02-21 DIAGNOSIS — I10 Essential (primary) hypertension: Secondary | ICD-10-CM

## 2019-02-21 DIAGNOSIS — Z6833 Body mass index (BMI) 33.0-33.9, adult: Secondary | ICD-10-CM | POA: Diagnosis not present

## 2019-02-21 DIAGNOSIS — F3342 Major depressive disorder, recurrent, in full remission: Secondary | ICD-10-CM | POA: Diagnosis not present

## 2019-02-21 DIAGNOSIS — K219 Gastro-esophageal reflux disease without esophagitis: Secondary | ICD-10-CM

## 2019-02-21 MED ORDER — ALPRAZOLAM 0.5 MG PO TABS: 0.5000 mg | ORAL_TABLET | Freq: Three times a day (TID) | ORAL | 2 refills | Status: DC

## 2019-02-21 MED ORDER — SERTRALINE HCL 100 MG PO TABS: ORAL_TABLET | ORAL | 1 refills | Status: DC

## 2019-02-21 MED ORDER — QSYMIA 7.5-46 MG PO CP24: 1.0000 | ORAL_CAPSULE | Freq: Every day | ORAL | 2 refills | Status: DC

## 2019-02-21 MED ORDER — LISINOPRIL 10 MG PO TABS: 10.0000 mg | ORAL_TABLET | Freq: Every day | ORAL | 1 refills | Status: DC

## 2019-02-21 NOTE — Progress Notes (Signed)
Subjective:    Patient ID: Gabriela Mccormick, female    DOB: Dec 12, 1965, 54 y.o.   MRN: [id number]   Chief Complaint: Medical Management of Chronic Issues    HPI:  1. Essential hypertension, benign Has not taken lisinopril for 2 weeks because she ran out of medicine. Denies SOB, chest pain, headaches, dizziness. Denies numbness or tingling of extremities. Does not check BP at home. BP Readings from Last 3 Encounters:  08/20/18 120/85  01/11/18 117/83  09/14/17 138/86     2. Gastroesophageal reflux disease without esophagitis No problems with GERD at this time.   3. Recurrent major depressive disorder, in full remission ([place]) No problems with depression or anxiety recently. GAD 7 : Generalized Anxiety Score 02/21/2019 08/20/2018  Nervous, Anxious, on Edge 0 1  Control/stop worrying 0 0  Worry too much - different things 0 1  Trouble relaxing 0 0  Restless 0 0  Easily annoyed or irritable 0 0  Afraid - awful might happen 0 1  Total GAD 7 Score 0 3  Anxiety Difficulty Not difficult at all Somewhat difficult     PHQ9 SCORE ONLY 02/21/2019 01/11/2018 09/14/2017  Score 0 0 0     4. BMI 33.0-33.9,adult Trying to eat healthy but sometimes skips dinner. Not exercising currently. Out of weight loss medication for a while and has gained 10 pounds. BMI Readings from Last 3 Encounters:  02/21/19 31.95 kg/m  08/20/18 30.19 kg/m  01/11/18 31.78 kg/m   Wt Readings from Last 3 Encounters:  02/21/19 192 lb (87.1 kg)  08/20/18 181 lb 6.4 oz (82.3 kg)  01/11/18 191 lb (86.6 kg)     5. Hyperlipidemia with target LDL less than 100 Trying to watch cholesterol and fat in diet. Lab Results  Component Value Date   CHOL 195 01/11/2018   HDL 57 01/11/2018   LDLCALC 98 01/11/2018   TRIG 200 (H) 01/11/2018   CHOLHDL 3.4 01/11/2018       Outpatient Encounter Medications as of 02/21/2019  Medication Sig  . ALPRAZolam (XANAX) 0.5 MG tablet TAKE 1 TABLET (0.5 MG TOTAL) BY MOUTH 3  (THREE) TIMES DAILY.  Marland Kitchen EPINEPHRINE 0.3 mg/0.3 mL IJ SOAJ injection INJECT 0.3 MLS (0.3 MG TOTAL) INTO THE MUSCLE ONCE.  Marland Kitchen lisinopril (ZESTRIL) 10 MG tablet TAKE 1 TABLET BY MOUTH EVERY DAY  . Phentermine-Topiramate (QSYMIA) 7.5-46 MG CP24 Take 1 tablet by mouth daily.  . sertraline (ZOLOFT) 100 MG tablet 1  po qd   No facility-administered encounter medications on file as of 02/21/2019.    Past Surgical History:  Procedure Laterality Date  . ABDOMINAL HYSTERECTOMY      No family history on file.  New complaints: None.  Social history: Lives with husband and son. Has been married for almost 30 years. Daughter lives in [place] and she has frequent contact with her.  Controlled substance contract: n/a     Review of Systems  Constitutional: Negative.   HENT: Negative.   Eyes: Negative.   Respiratory: Negative.   Cardiovascular: Negative.   Gastrointestinal: Negative.   Endocrine: Negative.   Genitourinary: Negative.   Musculoskeletal: Negative.   Skin: Negative.   Allergic/Immunologic: Negative.   Hematological: Negative.   Psychiatric/Behavioral: Negative.        Objective:   Physical Exam Vitals and nursing note reviewed.  Constitutional:      Appearance: Normal appearance. She is obese.  HENT:     Head: Normocephalic.     Right Ear: Tympanic membrane, ear  canal and external ear normal.     Left Ear: Tympanic membrane, ear canal and external ear normal.     Nose: Nose normal.     Mouth/Throat:     Mouth: Mucous membranes are moist.     Pharynx: Oropharynx is clear.  Eyes:     Pupils: Pupils are equal, round, and reactive to light.  Cardiovascular:     Rate and Rhythm: Normal rate and regular rhythm.     Pulses: Normal pulses.     Heart sounds: Normal heart sounds.  Pulmonary:     Effort: Pulmonary effort is normal.     Breath sounds: Normal breath sounds.  Abdominal:     General: Abdomen is flat. Bowel sounds are normal.     Palpations: Abdomen is  soft.  Musculoskeletal:        General: Normal range of motion.     Cervical back: Normal range of motion and neck supple.  Skin:    General: Skin is warm and dry.     Capillary Refill: Capillary refill takes less than 2 seconds.  Neurological:     General: No focal deficit present.     Mental Status: She is alert and oriented to person, place, and time.  Psychiatric:        Mood and Affect: Mood normal.        Behavior: Behavior normal.        Thought Content: Thought content normal.        Judgment: Judgment normal.      BP (!) 142/82   Pulse 88   Temp 98.4 F (36.9 C) (Temporal)   Resp 20   Ht 5\' 5"  (1.651 m)   Wt 192 lb (87.1 kg)   SpO2 95%   BMI 31.95 kg/m       Assessment & Plan:  Gabriela Mccormick comes in today with chief complaint of Medical Management of Chronic Issues   Diagnosis and orders addressed:  1. Essential hypertension, benign Low sodium diet. Check BP at home. Refill medications today.  2. Gastroesophageal reflux disease without esophagitis Avoid spicy foods.  3. Recurrent major depressive disorder, in full remission ([place]) Continue zoloft as prescribed.  4. BMI 33.0-33.9,adult Discussed diet and exercise for person with BMI >25 Will recheck weight in 3-6 months   5. Hyperlipidemia with target LDL less than 100 Low fat/low cholesterol diet.  Labs pending.  Scheduled mammogram Labs pending Health Maintenance reviewed Diet and exercise encouraged  Follow up plan: Follow up in 6 months.   [name], FNP

## 2019-02-21 NOTE — Addendum Note (Signed)
Addended by: Evelina Dun A on: 02/21/2019 06:00 PM   Modules accepted: Orders

## 2019-02-22 LAB — CMP14+EGFR
ALT: 37 IU/L — ABNORMAL HIGH (ref 0–32)
AST: 40 IU/L (ref 0–40)
Albumin/Globulin Ratio: 1.6 (ref 1.2–2.2)
Albumin: 4.5 g/dL (ref 3.8–4.9)
Alkaline Phosphatase: 92 IU/L (ref 39–117)
BUN/Creatinine Ratio: 14 (ref 9–23)
BUN: 11 mg/dL (ref 6–24)
Bilirubin Total: 0.3 mg/dL (ref 0.0–1.2)
CO2: 24 mmol/L (ref 20–29)
Calcium: 9.1 mg/dL (ref 8.7–10.2)
Chloride: 104 mmol/L (ref 96–106)
Creatinine, Ser: 0.78 mg/dL (ref 0.57–1.00)
GFR calc Af Amer: 100 mL/min/{1.73_m2} (ref >59)
GFR calc non Af Amer: 87 mL/min/{1.73_m2} (ref >59)
Globulin, Total: 2.8 g/dL (ref 1.5–4.5)
Glucose: 110 mg/dL — ABNORMAL HIGH (ref 65–99)
Potassium: 4.1 mmol/L (ref 3.5–5.2)
Sodium: 141 mmol/L (ref 134–144)
Total Protein: 7.3 g/dL (ref 6.0–8.5)

## 2019-02-22 LAB — LIPID PANEL
Chol/HDL Ratio: 3.6 ratio (ref 0.0–4.4)
Cholesterol, Total: 238 mg/dL — ABNORMAL HIGH (ref 100–199)
HDL: 66 mg/dL (ref >39)
LDL Chol Calc (NIH): 116 mg/dL — ABNORMAL HIGH (ref 0–99)
Triglycerides: 324 mg/dL — ABNORMAL HIGH (ref 0–149)
VLDL Cholesterol Cal: 56 mg/dL — ABNORMAL HIGH (ref 5–40)

## 2019-02-22 LAB — CBC WITH DIFFERENTIAL/PLATELET
Basophils Absolute: 0 10*3/uL (ref 0.0–0.2)
Basos: 0 %
EOS (ABSOLUTE): 0.1 10*3/uL (ref 0.0–0.4)
Eos: 1 %
Hematocrit: 38.1 % (ref 34.0–46.6)
Hemoglobin: 13.4 g/dL (ref 11.1–15.9)
Immature Grans (Abs): 0 10*3/uL (ref 0.0–0.1)
Immature Granulocytes: 0 %
Lymphocytes Absolute: 3 10*3/uL (ref 0.7–3.1)
Lymphs: 28 %
MCH: 34.3 pg — ABNORMAL HIGH (ref 26.6–33.0)
MCHC: 35.2 g/dL (ref 31.5–35.7)
MCV: 97 fL (ref 79–97)
Monocytes Absolute: 0.8 10*3/uL (ref 0.1–0.9)
Monocytes: 7 %
Neutrophils Absolute: 6.7 10*3/uL (ref 1.4–7.0)
Neutrophils: 64 %
Platelets: 232 10*3/uL (ref 150–450)
RBC: 3.91 x10E6/uL (ref 3.77–5.28)
RDW: 12.8 % (ref 11.7–15.4)
WBC: 10.7 10*3/uL (ref 3.4–10.8)

## 2019-03-11 DIAGNOSIS — L2389 Allergic contact dermatitis due to other agents: Secondary | ICD-10-CM | POA: Diagnosis not present

## 2019-03-14 DIAGNOSIS — L2389 Allergic contact dermatitis due to other agents: Secondary | ICD-10-CM | POA: Diagnosis not present

## 2019-03-25 ENCOUNTER — Telehealth: Payer: Self-pay | Admitting: *Deleted

## 2019-03-25 NOTE — Telephone Encounter (Signed)
Approved by plan - pt called and aware /Pharm called and aware as well

## 2019-03-25 NOTE — Telephone Encounter (Signed)
Osymia 7.5/46 mg caps  [facility] pt  Key [id number]  Sent to plan

## 2019-04-13 DIAGNOSIS — Z20828 Contact with and (suspected) exposure to other viral communicable diseases: Secondary | ICD-10-CM | POA: Diagnosis not present

## 2019-04-19 ENCOUNTER — Other Ambulatory Visit: Payer: Self-pay | Admitting: Family Medicine

## 2019-05-24 ENCOUNTER — Other Ambulatory Visit: Payer: Self-pay | Admitting: Family

## 2019-05-24 ENCOUNTER — Other Ambulatory Visit: Payer: Self-pay | Admitting: Nurse Practitioner

## 2019-05-24 DIAGNOSIS — F3342 Major depressive disorder, recurrent, in full remission: Secondary | ICD-10-CM

## 2019-05-24 DIAGNOSIS — Z6830 Body mass index (BMI) 30.0-30.9, adult: Secondary | ICD-10-CM

## 2019-06-27 DIAGNOSIS — C181 Malignant neoplasm of appendix: Secondary | ICD-10-CM | POA: Diagnosis not present

## 2019-06-27 DIAGNOSIS — Z9889 Other specified postprocedural states: Secondary | ICD-10-CM | POA: Diagnosis not present

## 2019-07-02 ENCOUNTER — Other Ambulatory Visit: Payer: Self-pay | Admitting: Nurse Practitioner

## 2019-07-02 DIAGNOSIS — Z1231 Encounter for screening mammogram for malignant neoplasm of breast: Secondary | ICD-10-CM

## 2019-07-16 ENCOUNTER — Ambulatory Visit
Admission: RE | Admit: 2019-07-16 | Discharge: 2019-07-16 | Disposition: A | Discharge status: Home / Self Care | Payer: BC Managed Care – PPO | Source: Ambulatory Visit | Attending: Nurse Practitioner | Admitting: Nurse Practitioner

## 2019-07-16 DIAGNOSIS — Z1231 Encounter for screening mammogram for malignant neoplasm of breast: Secondary | ICD-10-CM

## 2019-07-22 ENCOUNTER — Other Ambulatory Visit: Payer: Self-pay | Admitting: Nurse Practitioner

## 2019-07-22 ENCOUNTER — Ambulatory Visit
Admission: RE | Admit: 2019-07-22 | Discharge: 2019-07-22 | Disposition: A | Discharge status: Home / Self Care | Payer: BC Managed Care – PPO | Source: Ambulatory Visit | Attending: Nurse Practitioner | Admitting: Nurse Practitioner

## 2019-07-22 ENCOUNTER — Other Ambulatory Visit: Payer: Self-pay

## 2019-07-22 DIAGNOSIS — R928 Other abnormal and inconclusive findings on diagnostic imaging of breast: Secondary | ICD-10-CM | POA: Diagnosis not present

## 2019-07-22 DIAGNOSIS — N6489 Other specified disorders of breast: Secondary | ICD-10-CM | POA: Diagnosis not present

## 2019-07-30 ENCOUNTER — Other Ambulatory Visit: Payer: BC Managed Care – PPO

## 2019-08-21 ENCOUNTER — Other Ambulatory Visit: Payer: Self-pay

## 2019-08-21 ENCOUNTER — Ambulatory Visit (INDEPENDENT_AMBULATORY_CARE_PROVIDER_SITE_OTHER): Payer: BC Managed Care – PPO | Admitting: Nurse Practitioner

## 2019-08-21 ENCOUNTER — Encounter: Payer: Self-pay | Admitting: Nurse Practitioner

## 2019-08-21 VITALS — BP 119/80 | HR 91 | Temp 98.0°F | Resp 20 | Ht 65.0 in | Wt 187.0 lb

## 2019-08-21 DIAGNOSIS — I10 Essential (primary) hypertension: Secondary | ICD-10-CM | POA: Diagnosis not present

## 2019-08-21 DIAGNOSIS — F3342 Major depressive disorder, recurrent, in full remission: Secondary | ICD-10-CM | POA: Diagnosis not present

## 2019-08-21 DIAGNOSIS — Z6833 Body mass index (BMI) 33.0-33.9, adult: Secondary | ICD-10-CM

## 2019-08-21 DIAGNOSIS — E785 Hyperlipidemia, unspecified: Secondary | ICD-10-CM

## 2019-08-21 DIAGNOSIS — K219 Gastro-esophageal reflux disease without esophagitis: Secondary | ICD-10-CM

## 2019-08-21 MED ORDER — SERTRALINE HCL 100 MG PO TABS: ORAL_TABLET | ORAL | 1 refills | Status: DC

## 2019-08-21 MED ORDER — ALPRAZOLAM 0.5 MG PO TABS: 0.5000 mg | ORAL_TABLET | Freq: Three times a day (TID) | ORAL | 2 refills | Status: DC

## 2019-08-21 MED ORDER — LISINOPRIL 10 MG PO TABS: 10.0000 mg | ORAL_TABLET | Freq: Every day | ORAL | 1 refills | Status: DC

## 2019-08-21 NOTE — Progress Notes (Signed)
Subjective:    Patient ID: Gabriela Mccormick, female    DOB: 1965/11/15, 54 y.o.   MRN: [id number]  Subjective:    Subjective   Patient ID: Gabriela Mccormick, female    DOB: Oct 24, 1965, 54 y.o.   MRN: [id number]   Chief Complaint: Medical Management of Chronic Issues    HPI:  1. Essential hypertension, benign No c/o chest pain, sob or headache. Does ot check bood pressure at home.    BP Readings from Last 3 Encounters:  08/21/19 119/80  02/21/19 (!) 142/82  08/20/18 120/85     2. Hyperlipidemia with target LDL less than 100 Does watch diet and stays very active.      Lab Results  Component Value Date   CHOL 238 (H) 02/21/2019   HDL 66 02/21/2019   LDLCALC 116 (H) 02/21/2019   TRIG 324 (H) 02/21/2019   CHOLHDL 3.6 02/21/2019   The 10-year ASCVD risk score [name], et al., 2013) is: 2.1%   Values used to calculate the score:     Age: 15 years     Sex: Female     Is Non-Hispanic African American: No     Diabetic: No     Tobacco smoker: No     Systolic Blood Pressure: 027 mmHg     Is BP treated: Yes     HDL Cholesterol: 66 mg/dL     Total Cholesterol: 238 mg/dL    3. Gastroesophageal reflux disease without esophagitis Is doing well without symptoms.  4. Recurrent major depressive disorder, in full remission ([place]) Is on zoloft dialy and is doing well. Depression screen [facility] 2/9 08/21/2019 08/21/2019 02/21/2019  Decreased Interest 0 0 0  Down, Depressed, Hopeless 0 0 0  PHQ - 2 Score 0 0 0  Altered sleeping 0 - -  Tired, decreased energy 0 - -  Change in appetite 0 - -  Feeling bad or failure about yourself  0 - -  Trouble concentrating 0 - -  Moving slowly or fidgety/restless 0 - -  Suicidal thoughts 0 - -  PHQ-9 Score 0 - -  Difficult doing work/chores Not difficult at all - -     5. BMI 33.0-33.9,adult Weight is down 5lbs    Wt Readings from Last 3 Encounters:  08/21/19 187 lb (84.8 kg)  02/21/19 192 lb (87.1 kg)  08/20/18  181 lb 6.4 oz (82.3 kg)      BMI Readings from Last 3 Encounters:  08/21/19 31.12 kg/m  02/21/19 31.95 kg/m  08/20/18 30.19 kg/m           Outpatient Encounter Medications as of 08/21/2019  Medication Sig  . ALPRAZolam (XANAX) 0.5 MG tablet TAKE 1 TABLET (0.5 MG TOTAL) BY MOUTH 3 (THREE) TIMES DAILY.  Marland Kitchen EPINEPHRINE 0.3 mg/0.3 mL IJ SOAJ injection INJECT 0.3 MLS (0.3 MG TOTAL) INTO THE MUSCLE ONCE.  Marland Kitchen lisinopril (ZESTRIL) 10 MG tablet Take 1 tablet (10 mg total) by mouth daily.  Marland Kitchen QSYMIA 7.5-46 MG CP24 TAKE 1 CAPSULE BY MOUTH EVERY DAY  . sertraline (ZOLOFT) 100 MG tablet 1  po qd          Past Surgical History:  Procedure Laterality Date  . ABDOMINAL HYSTERECTOMY      History reviewed. No pertinent family history.  New complaints: None today  Social history: Husband is 120 days sober  Controlled substance contract: n/a     Review of Systems  Constitutional: Negative for diaphoresis.  Eyes: Negative for pain.  Respiratory: Negative for shortness of breath.   Cardiovascular: Negative for chest pain, palpitations and leg swelling.  Gastrointestinal: Negative for abdominal pain.  Endocrine: Negative for polydipsia.  Skin: Negative for rash.  Neurological: Negative for dizziness, weakness and headaches.  Hematological: Does not bruise/bleed easily.  All other systems reviewed and are negative.      Objective:   Physical Exam Vitals and nursing note reviewed.  Constitutional:      General: She is not in acute distress.    Appearance: Normal appearance. She is well-developed.  HENT:     Head: Normocephalic.     Nose: Nose normal.  Eyes:     Pupils: Pupils are equal, round, and reactive to light.  Neck:     Vascular: No carotid bruit or JVD.  Cardiovascular:     Rate and Rhythm: Normal rate and regular rhythm.     Heart sounds: Normal heart sounds.  Pulmonary:     Effort: Pulmonary effort is normal. No respiratory distress.      Breath sounds: Normal breath sounds. No wheezing or rales.  Chest:     Chest wall: No tenderness.  Abdominal:     General: Bowel sounds are normal. There is no distension or abdominal bruit.     Palpations: Abdomen is soft. There is no hepatomegaly, splenomegaly, mass or pulsatile mass.     Tenderness: There is no abdominal tenderness.  Musculoskeletal:        General: Normal range of motion.     Cervical back: Normal range of motion and neck supple.  Lymphadenopathy:     Cervical: No cervical adenopathy.  Skin:    General: Skin is warm and dry.  Neurological:     Mental Status: She is alert and oriented to person, place, and time.     Deep Tendon Reflexes: Reflexes are normal and symmetric.  Psychiatric:        Behavior: Behavior normal.        Thought Content: Thought content normal.        Judgment: Judgment normal.    BP 119/80   Pulse 91   Temp 98 F (36.7 C) (Temporal)   Resp 20   Ht _0  (1.651 m)   Wt 187 lb (84.8 kg)   SpO2 95%   BMI 31.12 kg/m         Assessment & Plan:  Gabriela Mccormick comes in today with chief complaint of Medical Management of Chronic Issues   Diagnosis and orders addressed:  1. Essential hypertension, benign Low sodium diet - lisinopril (ZESTRIL) 10 MG tablet; Take 1 tablet (10 mg total) by mouth daily.  Dispense: 90 tablet; Refill: 1 - CBC with Differential/Platelet - CMP14+EGFR  2. Hyperlipidemia with target LDL less than 100 Low fat diet - Lipid panel  3. Gastroesophageal reflux disease without esophagitis Avoid spicy foods Do not eat 2 hours prior to bedtime  4. Recurrent major depressive disorder, in full remission ([place]) - ALPRAZolam (XANAX) 0.5 MG tablet; Take 1 tablet (0.5 mg total) by mouth 3 (three) times daily.  Dispense: 90 tablet; Refill: 2 - sertraline (ZOLOFT) 100 MG tablet; 1  po qd  Dispense: 90 tablet; Refill: 1  5. BMI 33.0-33.9,adult Discussed diet and exercise for person with BMI >25 Will recheck  weight in 3-6 months    Labs pending Health Maintenance reviewed Diet and exercise encouraged  Follow up plan: 6 months   [name], FNP

## 2019-09-07 ENCOUNTER — Other Ambulatory Visit: Payer: Self-pay | Admitting: Nurse Practitioner

## 2019-09-07 DIAGNOSIS — Z6830 Body mass index (BMI) 30.0-30.9, adult: Secondary | ICD-10-CM

## 2019-10-01 ENCOUNTER — Encounter: Payer: Self-pay | Admitting: Family Medicine

## 2019-12-02 ENCOUNTER — Other Ambulatory Visit: Payer: Self-pay | Admitting: Nurse Practitioner

## 2019-12-02 DIAGNOSIS — F3342 Major depressive disorder, recurrent, in full remission: Secondary | ICD-10-CM

## 2019-12-08 ENCOUNTER — Telehealth: Payer: Self-pay

## 2019-12-08 NOTE — Telephone Encounter (Signed)
  Incoming Patient Call  12/08/2019  What symptoms do you have? Hernia is popping out and hurts. Patient is putting ice on it. She wants to schedule surgery before the Christmas Holidays and MMM told her to call when it started bothering her  How long have you been sick? One month now and she has had enough  Have you been seen for this problem? Yes  If your provider decides to give you a prescription, which pharmacy would you [facility] for [facility] be [facility]   Patient informed that this information will be sent to the clinical staff for review and that they should receive a follow up call.

## 2019-12-09 ENCOUNTER — Telehealth: Payer: Self-pay

## 2019-12-09 ENCOUNTER — Other Ambulatory Visit: Payer: Self-pay | Admitting: Nurse Practitioner

## 2019-12-09 DIAGNOSIS — K429 Umbilical hernia without obstruction or gangrene: Secondary | ICD-10-CM

## 2019-12-09 NOTE — Progress Notes (Signed)
General surgeon

## 2019-12-09 NOTE — Telephone Encounter (Signed)
Pt returned missed call from MMM regarding previous telephone message.   Says she had stomach cancer and had surgery for it back in May 2020 at [facility] and saw MMM for a post surgical follow up which is when MMM noticed that pt had a "post surgical hernia" above her belly button and advised pt to call MMM and let her know if it starting bothering her and felt like she needed surgery.  Pt says the hernia has been causing her to have a lot of pain for the past month and she cant take the pain anymore and wants MMM to send her for surgery.  Pt says if she needs an appt then she needs one ASAP. Please advise and call pt.

## 2019-12-17 DIAGNOSIS — M6208 Separation of muscle (nontraumatic), other site: Secondary | ICD-10-CM | POA: Diagnosis not present

## 2019-12-17 DIAGNOSIS — Z1231 Encounter for screening mammogram for malignant neoplasm of breast: Secondary | ICD-10-CM | POA: Diagnosis not present

## 2019-12-17 DIAGNOSIS — K432 Incisional hernia without obstruction or gangrene: Secondary | ICD-10-CM | POA: Diagnosis not present

## 2019-12-30 DIAGNOSIS — E785 Hyperlipidemia, unspecified: Secondary | ICD-10-CM | POA: Diagnosis not present

## 2019-12-30 DIAGNOSIS — M6208 Separation of muscle (nontraumatic), other site: Secondary | ICD-10-CM | POA: Diagnosis not present

## 2019-12-30 DIAGNOSIS — E669 Obesity, unspecified: Secondary | ICD-10-CM | POA: Diagnosis not present

## 2019-12-30 DIAGNOSIS — K432 Incisional hernia without obstruction or gangrene: Secondary | ICD-10-CM | POA: Diagnosis not present

## 2019-12-30 DIAGNOSIS — Z6831 Body mass index (BMI) 31.0-31.9, adult: Secondary | ICD-10-CM | POA: Diagnosis not present

## 2019-12-30 DIAGNOSIS — Z79899 Other long term (current) drug therapy: Secondary | ICD-10-CM | POA: Diagnosis not present

## 2019-12-30 DIAGNOSIS — F329 Major depressive disorder, single episode, unspecified: Secondary | ICD-10-CM | POA: Diagnosis not present

## 2019-12-30 DIAGNOSIS — Z888 Allergy status to other drugs, medicaments and biological substances status: Secondary | ICD-10-CM | POA: Diagnosis not present

## 2019-12-30 DIAGNOSIS — Z5331 Laparoscopic surgical procedure converted to open procedure: Secondary | ICD-10-CM | POA: Diagnosis not present

## 2019-12-30 DIAGNOSIS — I10 Essential (primary) hypertension: Secondary | ICD-10-CM | POA: Diagnosis not present

## 2019-12-30 DIAGNOSIS — Z91030 Bee allergy status: Secondary | ICD-10-CM | POA: Diagnosis not present

## 2019-12-31 DIAGNOSIS — Z6831 Body mass index (BMI) 31.0-31.9, adult: Secondary | ICD-10-CM | POA: Diagnosis not present

## 2019-12-31 DIAGNOSIS — F329 Major depressive disorder, single episode, unspecified: Secondary | ICD-10-CM | POA: Diagnosis not present

## 2019-12-31 DIAGNOSIS — Z79899 Other long term (current) drug therapy: Secondary | ICD-10-CM | POA: Diagnosis not present

## 2019-12-31 DIAGNOSIS — I10 Essential (primary) hypertension: Secondary | ICD-10-CM | POA: Diagnosis not present

## 2019-12-31 DIAGNOSIS — Z91030 Bee allergy status: Secondary | ICD-10-CM | POA: Diagnosis not present

## 2019-12-31 DIAGNOSIS — K432 Incisional hernia without obstruction or gangrene: Secondary | ICD-10-CM | POA: Diagnosis not present

## 2019-12-31 DIAGNOSIS — E785 Hyperlipidemia, unspecified: Secondary | ICD-10-CM | POA: Diagnosis not present

## 2019-12-31 DIAGNOSIS — Z888 Allergy status to other drugs, medicaments and biological substances status: Secondary | ICD-10-CM | POA: Diagnosis not present

## 2019-12-31 DIAGNOSIS — E669 Obesity, unspecified: Secondary | ICD-10-CM | POA: Diagnosis not present

## 2020-01-15 ENCOUNTER — Other Ambulatory Visit: Payer: Self-pay | Admitting: Nurse Practitioner

## 2020-01-15 DIAGNOSIS — Z6830 Body mass index (BMI) 30.0-30.9, adult: Secondary | ICD-10-CM

## 2020-01-19 ENCOUNTER — Other Ambulatory Visit: Payer: Self-pay | Admitting: Nurse Practitioner

## 2020-01-19 DIAGNOSIS — Z6830 Body mass index (BMI) 30.0-30.9, adult: Secondary | ICD-10-CM

## 2020-01-19 MED ORDER — QSYMIA 7.5-46 MG PO CP24: 1.0000 | ORAL_CAPSULE | Freq: Every day | ORAL | 0 refills | Status: DC

## 2020-01-19 NOTE — Addendum Note (Signed)
Addended by: Julious Payer D on: 01/19/2020 10:02 AM   Modules accepted: Orders

## 2020-01-23 ENCOUNTER — Other Ambulatory Visit: Payer: BC Managed Care – PPO

## 2020-01-26 ENCOUNTER — Telehealth: Payer: Self-pay

## 2020-01-26 NOTE — Telephone Encounter (Signed)
Spoke with patient and advised her we would send for Prior Authorization and advise her of outcome.  Please file PA per Abigail Butts.

## 2020-01-27 NOTE — Telephone Encounter (Signed)
PA for Qsymia-IN Process  Key: [id number]   Your information has been submitted to [place]. [employer] [place] will review the request and notify you of the determination decision directly, typically within 72 hours of receiving all information.  You will also receive your request decision electronically. To check for an update later, open this request again from your dashboard.  If [employer] has not responded within the specified timeframe or if you have any questions about your PA submission, contact [facility] [place] directly at [phone].

## 2020-01-31 NOTE — Telephone Encounter (Signed)
Effective from 01/27/2020 through 01/25/2021. continued coverage  Pharmacy aware.

## 2020-02-21 ENCOUNTER — Other Ambulatory Visit: Payer: Self-pay | Admitting: Nurse Practitioner

## 2020-02-21 DIAGNOSIS — I10 Essential (primary) hypertension: Secondary | ICD-10-CM

## 2020-02-24 ENCOUNTER — Other Ambulatory Visit: Payer: Self-pay

## 2020-02-26 ENCOUNTER — Ambulatory Visit: Payer: Self-pay | Admitting: Nurse Practitioner

## 2020-03-11 ENCOUNTER — Encounter: Payer: Self-pay | Admitting: Nurse Practitioner

## 2020-03-11 ENCOUNTER — Ambulatory Visit (INDEPENDENT_AMBULATORY_CARE_PROVIDER_SITE_OTHER): Payer: BC Managed Care – PPO | Admitting: Nurse Practitioner

## 2020-03-11 ENCOUNTER — Other Ambulatory Visit: Payer: Self-pay

## 2020-03-11 VITALS — BP 130/74 | HR 72 | Temp 97.6°F | Resp 20 | Ht 65.0 in | Wt 184.0 lb

## 2020-03-11 DIAGNOSIS — I10 Essential (primary) hypertension: Secondary | ICD-10-CM | POA: Insufficient documentation

## 2020-03-11 DIAGNOSIS — K219 Gastro-esophageal reflux disease without esophagitis: Secondary | ICD-10-CM | POA: Diagnosis not present

## 2020-03-11 DIAGNOSIS — E785 Hyperlipidemia, unspecified: Secondary | ICD-10-CM | POA: Diagnosis not present

## 2020-03-11 DIAGNOSIS — Z6833 Body mass index (BMI) 33.0-33.9, adult: Secondary | ICD-10-CM

## 2020-03-11 DIAGNOSIS — F3342 Major depressive disorder, recurrent, in full remission: Secondary | ICD-10-CM

## 2020-03-11 MED ORDER — ALPRAZOLAM 0.5 MG PO TABS: 0.5000 mg | ORAL_TABLET | Freq: Three times a day (TID) | ORAL | 2 refills | Status: DC

## 2020-03-11 MED ORDER — SERTRALINE HCL 100 MG PO TABS: ORAL_TABLET | ORAL | 1 refills | Status: DC

## 2020-03-11 MED ORDER — LISINOPRIL 10 MG PO TABS: 10.0000 mg | ORAL_TABLET | Freq: Every day | ORAL | 1 refills | Status: DC

## 2020-03-11 NOTE — Patient Instructions (Signed)
Textbook of family medicine (9th ed., pp. 1062-1073). [place], [place]: [employer].">  Stress, Adult Stress is a normal reaction to life events. Stress is what you feel when life demands more than you are used to, or more than you think you can handle. Some stress can be useful, such as studying for a test or meeting a deadline at work. Stress that occurs too often or for too long can cause problems. It can affect your emotional health and interfere with relationships and normal daily activities. Too much stress can weaken your body's defense system (immune system) and increase your risk for physical illness. If you already have a medical problem, stress can make it worse. What are the causes? All sorts of life events can cause stress. An event that causes stress for one person may not be stressful for another person. Major life events, whether positive or negative, commonly cause stress. Examples include:  Losing a job or starting a new job.  Losing a loved one.  Moving to a new town or home.  Getting married or divorced.  Having a baby.  Getting injured or sick. Less obvious life events can also cause stress, especially if they occur day after day or in combination with each other. Examples include:  Working long hours.  Driving in traffic.  Caring for children.  Being in debt.  Being in a difficult relationship. What are the signs or symptoms? Stress can cause emotional symptoms, including:  Anxiety. This is feeling worried, afraid, on edge, overwhelmed, or out of control.  Anger, including irritation or impatience.  Depression. This is feeling sad, down, helpless, or guilty.  Trouble focusing, remembering, or making decisions. Stress can cause physical symptoms, including:  Aches and pains. These may affect your head, neck, back, stomach, or other areas of your body.  Tight muscles or a clenched jaw.  Low energy.  Trouble sleeping. Stress can cause unhealthy  behaviors, including:  Eating to feel better (overeating) or skipping meals.  Working too much or putting off tasks.  Smoking, drinking alcohol, or using drugs to feel better. How is this diagnosed? Stress is diagnosed through an assessment by your health care provider. He or she may diagnose this condition based on:  Your symptoms and any stressful life events.  Your medical history.  Tests to rule out other causes of your symptoms. Depending on your condition, your health care provider may refer you to a specialist for further evaluation. How is this treated? Stress management techniques are the recommended treatment for stress. Medicine is not typically recommended for the treatment of stress. Techniques to reduce your reaction to stressful life events include:  Stress identification. Monitor yourself for symptoms of stress and identify what causes stress for you. These skills may help you to avoid or prepare for stressful events.  Time management. Set your priorities, keep a calendar of events, and learn to say no. Taking these actions can help you avoid making too many commitments. Techniques for coping with stress include:  Rethinking the problem. Try to think realistically about stressful events rather than ignoring them or overreacting. Try to find the positives in a stressful situation rather than focusing on the negatives.  Exercise. Physical exercise can release both physical and emotional tension. The key is to find a form of exercise that you enjoy and do it regularly.  Relaxation techniques. These relax the body and mind. The key is to find one or more that you enjoy and use the techniques regularly. Examples include: ?   Meditation, deep breathing, or progressive relaxation techniques. ? Yoga or tai chi. ? Biofeedback, mindfulness techniques, or journaling. ? Listening to music, being out in nature, or participating in other hobbies.  Practicing a healthy lifestyle.  Eat a balanced diet, drink plenty of water, limit or avoid caffeine, and get plenty of sleep.  Having a strong support network. Spend time with family, friends, or other people you enjoy being around. Express your feelings and talk things over with someone you trust. Counseling or talk therapy with a mental health professional may be helpful if you are having trouble managing stress on your own.   Follow these instructions at home: Lifestyle  Avoid drugs.  Do not use any products that contain nicotine or tobacco, such as cigarettes, e-cigarettes, and chewing tobacco. If you need help quitting, ask your health care provider.  Limit alcohol intake to no more than 1 drink a day for nonpregnant women and 2 drinks a day for men. One drink equals 12 oz of beer, 5 oz of wine, or 1 oz of hard liquor  Do not use alcohol or drugs to relax.  Eat a balanced diet that includes fresh fruits and vegetables, whole grains, lean meats, fish, eggs, and beans, and low-fat dairy. Avoid processed foods and foods high in added fat, sugar, and salt.  Exercise at least 30 minutes on 5 or more days each week.  Get 7-8 hours of sleep each night.   General instructions  Practice stress management techniques as discussed with your health care provider.  Drink enough fluid to keep your urine clear or pale yellow.  Take over-the-counter and prescription medicines only as told by your health care provider.  Keep all follow-up visits as told by your health care provider. This is important.   Contact a health care provider if:  Your symptoms get worse.  You have new symptoms.  You feel overwhelmed by your problems and can no longer manage them on your own. Get help right away if:  You have thoughts of hurting yourself or others. If you ever feel like you may hurt yourself or others, or have thoughts about taking your own life, get help right away. You can go to your nearest emergency department or  call:  Your local emergency services (911 in the [place]  A suicide crisis helpline, such as the National Suicide Prevention Lifeline at 1-800-273-8255. This is open 24 hours a day. Summary  Stress is a normal reaction to life events. It can cause problems if it happens too often or for too long.  Practicing stress management techniques is the best way to treat stress.  Counseling or talk therapy with a mental health professional may be helpful if you are having trouble managing stress on your own. This information is not intended to replace advice given to you by your health care provider. Make sure you discuss any questions you have with your health care provider. Document Revised: 09/19/2019 Document Reviewed: 09/19/2019 Elsevier Patient Education  2021 [employer]  

## 2020-03-11 NOTE — Progress Notes (Addendum)
Subjective:    Patient ID: Gabriela Mccormick, female    DOB: [birth year], 55 y.o.   MRN: [id number]   Chief Complaint: medical management of chronic issues     HPI:  1. Primary hypertension No c/o chest pain, sob or headache. Does not check blood pressure at home. BP Readings from Last 3 Encounters:  03/11/20 130/74  08/21/19 119/80  02/21/19 (!) 142/82       2. Hyperlipidemia with target LDL less than 100 Does try to watch diet but does not do much exercise. Lab Results  Component Value Date   CHOL 238 (H) 02/21/2019   HDL 66 02/21/2019   LDLCALC 116 (H) 02/21/2019   TRIG 324 (H) 02/21/2019   CHOLHDL 3.6 02/21/2019   The 10-year ASCVD risk score Gabriela Mccormick., et al., 2013) is: 2.5%   3. Gastroesophageal reflux disease without esophagitis Has not had any issues  4. Recurrent major depressive disorder, in full remission (Gabriela Mccormick) Is on zoloft daily and is doing well. No side effects from medication. Depression screen Gabriela Mccormick 2/9 03/11/2020 08/21/2019 08/21/2019  Decreased Interest 1 0 0  Down, Depressed, Hopeless 1 0 0  PHQ - 2 Score 2 0 0  Altered sleeping 0 0 -  Tired, decreased energy 0 0 -  Change in appetite 0 0 -  Feeling bad or failure about yourself  0 0 -  Trouble concentrating 0 0 -  Moving slowly or fidgety/restless 0 0 -  Suicidal thoughts 0 0 -  PHQ-9 Score 2 0 -  Difficult doing work/chores Not difficult at all Not difficult at all -     5. BMI 33.0-33.9,adult Weight is down 3 lbs  Wt Readings from Last 3 Encounters:  03/11/20 184 lb (83.5 kg)  08/21/19 187 lb (84.8 kg)  02/21/19 192 lb (87.1 kg)   BMI Readings from Last 3 Encounters:  03/11/20 30.62 kg/m  08/21/19 31.12 kg/m  02/21/19 31.95 kg/m       Outpatient Encounter Medications as of 03/11/2020  Medication Sig  . ALPRAZolam (XANAX) 0.5 MG tablet TAKE 1 TABLET (0.5 MG TOTAL) BY MOUTH 3 (THREE) TIMES DAILY.  Marland Kitchen EPINEPHRINE 0.3 mg/0.3 mL IJ SOAJ injection INJECT 0.3 MLS (0.3 MG TOTAL)  INTO THE MUSCLE ONCE.  Marland Kitchen lisinopril (ZESTRIL) 10 MG tablet TAKE 1 TABLET BY MOUTH EVERY DAY  . Phentermine-Topiramate (QSYMIA) 7.5-46 MG CP24 Take 1 tablet by mouth daily.  . sertraline (ZOLOFT) 100 MG tablet 1  po qd   No facility-administered encounter medications on file as of 03/11/2020.    Past Surgical History:  Procedure Laterality Date  . ABDOMINAL HYSTERECTOMY      No family history on file.  New complaints: None today  Social history: Lives with her husband- her son just moved out.  Controlled substance contract: n/a    Review of Systems  Constitutional: Negative for diaphoresis.  Eyes: Negative for pain.  Respiratory: Negative for shortness of breath.   Cardiovascular: Negative for chest pain, palpitations and leg swelling.  Gastrointestinal: Negative for abdominal pain.  Endocrine: Negative for polydipsia.  Skin: Negative for rash.  Neurological: Negative for dizziness, weakness and headaches.  Hematological: Does not bruise/bleed easily.  All other systems reviewed and are negative.      Objective:   Physical Exam Vitals and nursing note reviewed.  Constitutional:      General: She is not in acute distress.    Appearance: Normal appearance. She is well-developed and well-nourished.  HENT:  Head: Normocephalic.     Nose: Nose normal.     Mouth/Throat:     Mouth: Oropharynx is clear and moist.  Eyes:     Extraocular Movements: EOM normal.     Pupils: Pupils are equal, round, and reactive to light.  Neck:     Vascular: No carotid bruit or JVD.  Cardiovascular:     Rate and Rhythm: Normal rate and regular rhythm.     Pulses: Intact distal pulses.     Heart sounds: Normal heart sounds.  Pulmonary:     Effort: Pulmonary effort is normal. No respiratory distress.     Breath sounds: Normal breath sounds. No wheezing or rales.  Chest:     Chest wall: No tenderness.  Abdominal:     General: Bowel sounds are normal. There is no distension or  abdominal bruit. Aorta is normal.     Palpations: Abdomen is soft. There is no hepatomegaly, splenomegaly, mass or pulsatile mass.     Tenderness: There is no abdominal tenderness.  Musculoskeletal:        General: No edema. Normal range of motion.     Cervical back: Normal range of motion and neck supple.  Lymphadenopathy:     Cervical: No cervical adenopathy.  Skin:    General: Skin is warm and dry.  Neurological:     Mental Status: She is alert and oriented to person, place, and time.     Deep Tendon Reflexes: Reflexes are normal and symmetric.  Psychiatric:        Mood and Affect: Mood and affect normal.        Behavior: Behavior normal.        Thought Content: Thought content normal.        Judgment: Judgment normal.    BP 130/74   Pulse 72   Temp 97.6 F (36.4 C) (Temporal)   Resp 20   Ht _0  (1.651 m)   Wt 184 lb (83.5 kg)   SpO2 95%   BMI 30.62 kg/m         Assessment & Plan:  Gabriela Mccormick comes in today with chief complaint of Medical Management of Chronic Issues   Diagnosis and orders addressed:  1. Primary hypertension Low sodium diet - lisinopril (ZESTRIL) 10 MG tablet; Take 1 tablet (10 mg total) by mouth daily.  Dispense: 90 tablet; Refill: 1  2. Hyperlipidemia with target LDL less than 100 Low fat diet  3. Gastroesophageal reflux disease without esophagitis Avoid spicy foods Do not eat 2 hours prior to bedtime  4. Recurrent major depressive disorder, in full remission (Gabriela Mccormick) Stress management - ALPRAZolam (XANAX) 0.5 MG tablet; Take 1 tablet (0.5 mg total) by mouth 3 (three) times daily.  Dispense: 90 tablet; Refill: 2 - sertraline (ZOLOFT) 100 MG tablet; 1  po qd  Dispense: 90 tablet; Refill: 1  5. BMI 33.0-33.9,adult Discussed diet and exercise for person with BMI >25 Will recheck weight in 3-6 months  Orders Placed This Encounter  Procedures  . CBC with Differential/Platelet  . CMP14+EGFR  . Lipid panel      Labs  pending Health Maintenance reviewed Diet and exercise encouraged  Follow up plan: 6 months   [name], FNP

## 2020-03-11 NOTE — Addendum Note (Signed)
Addended by: Chevis Pretty on: 03/11/2020 03:02 PM   Modules accepted: Orders

## 2020-03-12 LAB — LIPID PANEL
Chol/HDL Ratio: 3.9 ratio (ref 0.0–4.4)
Cholesterol, Total: 260 mg/dL — ABNORMAL HIGH (ref 100–199)
HDL: 66 mg/dL (ref >39)
LDL Chol Calc (NIH): 143 mg/dL — ABNORMAL HIGH (ref 0–99)
Triglycerides: 283 mg/dL — ABNORMAL HIGH (ref 0–149)
VLDL Cholesterol Cal: 51 mg/dL — ABNORMAL HIGH (ref 5–40)

## 2020-03-12 LAB — CBC WITH DIFFERENTIAL/PLATELET
Basophils Absolute: 0 10*3/uL (ref 0.0–0.2)
Basos: 0 %
EOS (ABSOLUTE): 0.1 10*3/uL (ref 0.0–0.4)
Eos: 2 %
Hematocrit: 43.3 % (ref 34.0–46.6)
Hemoglobin: 14.5 g/dL (ref 11.1–15.9)
Immature Grans (Abs): 0 10*3/uL (ref 0.0–0.1)
Immature Granulocytes: 0 %
Lymphocytes Absolute: 2.8 10*3/uL (ref 0.7–3.1)
Lymphs: 31 %
MCH: 31.7 pg (ref 26.6–33.0)
MCHC: 33.5 g/dL (ref 31.5–35.7)
MCV: 95 fL (ref 79–97)
Monocytes Absolute: 0.6 10*3/uL (ref 0.1–0.9)
Monocytes: 7 %
Neutrophils Absolute: 5.4 10*3/uL (ref 1.4–7.0)
Neutrophils: 60 %
Platelets: 229 10*3/uL (ref 150–450)
RBC: 4.57 x10E6/uL (ref 3.77–5.28)
RDW: 12 % (ref 11.7–15.4)
WBC: 9 10*3/uL (ref 3.4–10.8)

## 2020-03-12 LAB — CMP14+EGFR
ALT: 46 IU/L — ABNORMAL HIGH (ref 0–32)
AST: 47 IU/L — ABNORMAL HIGH (ref 0–40)
Albumin/Globulin Ratio: 1.5 (ref 1.2–2.2)
Albumin: 4.7 g/dL (ref 3.8–4.9)
Alkaline Phosphatase: 90 IU/L (ref 44–121)
BUN/Creatinine Ratio: 18 (ref 9–23)
BUN: 13 mg/dL (ref 6–24)
Bilirubin Total: 0.4 mg/dL (ref 0.0–1.2)
CO2: 20 mmol/L (ref 20–29)
Calcium: 9.9 mg/dL (ref 8.7–10.2)
Chloride: 102 mmol/L (ref 96–106)
Creatinine, Ser: 0.73 mg/dL (ref 0.57–1.00)
GFR calc Af Amer: 108 mL/min/{1.73_m2} (ref >59)
GFR calc non Af Amer: 94 mL/min/{1.73_m2} (ref >59)
Globulin, Total: 3.2 g/dL (ref 1.5–4.5)
Glucose: 101 mg/dL — ABNORMAL HIGH (ref 65–99)
Potassium: 3.9 mmol/L (ref 3.5–5.2)
Sodium: 141 mmol/L (ref 134–144)
Total Protein: 7.9 g/dL (ref 6.0–8.5)

## 2020-03-13 ENCOUNTER — Other Ambulatory Visit: Payer: Self-pay | Admitting: Nurse Practitioner

## 2020-03-13 DIAGNOSIS — Z6830 Body mass index (BMI) 30.0-30.9, adult: Secondary | ICD-10-CM

## 2020-04-08 ENCOUNTER — Other Ambulatory Visit: Payer: Self-pay

## 2020-04-20 ENCOUNTER — Other Ambulatory Visit: Payer: Self-pay | Admitting: Nurse Practitioner

## 2020-04-20 DIAGNOSIS — Z6830 Body mass index (BMI) 30.0-30.9, adult: Secondary | ICD-10-CM

## 2020-04-21 ENCOUNTER — Other Ambulatory Visit: Payer: Self-pay | Admitting: Nurse Practitioner

## 2020-04-21 DIAGNOSIS — Z6830 Body mass index (BMI) 30.0-30.9, adult: Secondary | ICD-10-CM

## 2020-04-22 ENCOUNTER — Other Ambulatory Visit: Payer: Self-pay | Admitting: Nurse Practitioner

## 2020-04-22 DIAGNOSIS — Z6830 Body mass index (BMI) 30.0-30.9, adult: Secondary | ICD-10-CM

## 2020-04-22 MED ORDER — QSYMIA 7.5-46 MG PO CP24: 1.0000 | ORAL_CAPSULE | Freq: Every day | ORAL | 5 refills | Status: DC

## 2020-04-22 NOTE — Addendum Note (Signed)
Addended by: Rolena Infante on: 04/22/2020 11:57 AM   Modules accepted: Orders

## 2020-05-20 ENCOUNTER — Other Ambulatory Visit: Payer: Self-pay | Admitting: Nurse Practitioner

## 2020-05-20 ENCOUNTER — Other Ambulatory Visit: Payer: Self-pay

## 2020-05-20 ENCOUNTER — Ambulatory Visit: Payer: Self-pay

## 2020-05-20 ENCOUNTER — Ambulatory Visit
Admission: RE | Admit: 2020-05-20 | Discharge: 2020-05-20 | Disposition: A | Discharge status: Home / Self Care | Payer: BC Managed Care – PPO | Source: Ambulatory Visit | Attending: Nurse Practitioner | Admitting: Nurse Practitioner

## 2020-05-20 DIAGNOSIS — R922 Inconclusive mammogram: Secondary | ICD-10-CM | POA: Diagnosis not present

## 2020-05-20 DIAGNOSIS — N6489 Other specified disorders of breast: Secondary | ICD-10-CM

## 2020-05-20 DIAGNOSIS — R928 Other abnormal and inconclusive findings on diagnostic imaging of breast: Secondary | ICD-10-CM

## 2020-05-31 ENCOUNTER — Telehealth: Payer: Self-pay | Admitting: Nurse Practitioner

## 2020-05-31 MED ORDER — SCOPOLAMINE 1 MG/3DAYS TD PT72: 1.0000 | MEDICATED_PATCH | TRANSDERMAL | 1 refills | Status: DC

## 2020-05-31 NOTE — Telephone Encounter (Signed)
Left detailed message on patients voicemail that rx sent to pharmacy 

## 2020-05-31 NOTE — Telephone Encounter (Signed)
Pt leaving next Thursday 5/26 to go on a cruise. Would like motion sickness patches that go behind ear.

## 2020-05-31 NOTE — Telephone Encounter (Signed)
Please review and advise.

## 2020-05-31 NOTE — Telephone Encounter (Signed)
Prescritption sent in - have fun!!!

## 2020-09-06 ENCOUNTER — Other Ambulatory Visit: Payer: Self-pay | Admitting: Nurse Practitioner

## 2020-09-06 DIAGNOSIS — F3342 Major depressive disorder, recurrent, in full remission: Secondary | ICD-10-CM

## 2020-09-09 ENCOUNTER — Other Ambulatory Visit: Payer: Self-pay

## 2020-09-09 ENCOUNTER — Ambulatory Visit (INDEPENDENT_AMBULATORY_CARE_PROVIDER_SITE_OTHER): Payer: BC Managed Care – PPO | Admitting: Nurse Practitioner

## 2020-09-09 ENCOUNTER — Encounter: Payer: Self-pay | Admitting: Nurse Practitioner

## 2020-09-09 ENCOUNTER — Ambulatory Visit: Payer: BC Managed Care – PPO | Admitting: Nurse Practitioner

## 2020-09-09 VITALS — BP 121/76 | HR 85 | Temp 97.5°F | Resp 20 | Ht 65.0 in | Wt 169.0 lb

## 2020-09-09 DIAGNOSIS — I10 Essential (primary) hypertension: Secondary | ICD-10-CM

## 2020-09-09 DIAGNOSIS — K219 Gastro-esophageal reflux disease without esophagitis: Secondary | ICD-10-CM

## 2020-09-09 DIAGNOSIS — E785 Hyperlipidemia, unspecified: Secondary | ICD-10-CM | POA: Diagnosis not present

## 2020-09-09 DIAGNOSIS — F3342 Major depressive disorder, recurrent, in full remission: Secondary | ICD-10-CM | POA: Diagnosis not present

## 2020-09-09 DIAGNOSIS — Z6828 Body mass index (BMI) 28.0-28.9, adult: Secondary | ICD-10-CM

## 2020-09-09 MED ORDER — ALPRAZOLAM 0.5 MG PO TABS: 0.5000 mg | ORAL_TABLET | Freq: Three times a day (TID) | ORAL | 5 refills | Status: DC

## 2020-09-09 MED ORDER — QSYMIA 7.5-46 MG PO CP24: 1.0000 | ORAL_CAPSULE | Freq: Every day | ORAL | 5 refills | Status: DC

## 2020-09-09 MED ORDER — LISINOPRIL 10 MG PO TABS: 10.0000 mg | ORAL_TABLET | Freq: Every day | ORAL | 1 refills | Status: DC

## 2020-09-09 MED ORDER — SERTRALINE HCL 100 MG PO TABS: ORAL_TABLET | ORAL | 1 refills | Status: DC

## 2020-09-09 NOTE — Progress Notes (Signed)
Subjective:    Patient ID: Gabriela Mccormick, female    DOB: Apr 16, 1965, 55 y.o.   MRN: [id number]   Chief Complaint: medical management of chronic issues     HPI:  1. Primary hypertension No c/o chest pain, sob or headache. Does not check blood pressure at home. BP Readings from Last 3 Encounters:  03/11/20 130/74  08/21/19 119/80  02/21/19 (!) 142/82     2. Hyperlipidemia with target LDL less than 100 [place] swatch diet and stays very active. Lab Results  Component Value Date   CHOL 260 (H) 03/11/2020   HDL 66 03/11/2020   LDLCALC 143 (H) 03/11/2020   TRIG 283 (H) 03/11/2020   CHOLHDL 3.9 03/11/2020     3. Gastroesophageal reflux disease without esophagitis Has been doing well with no symptoms.  4. Recurrent major depressive disorder, in full remission ([place]) Is on zoloft daily and xanax. Combination is working well. Depression screen [facility] 2/9 09/09/2020 03/11/2020 08/21/2019  Decreased Interest 0 1 0  Down, Depressed, Hopeless 0 1 0  PHQ - 2 Score 0 2 0  Altered sleeping 0 0 0  Tired, decreased energy 0 0 0  Change in appetite 0 0 0  Feeling bad or failure about yourself  0 0 0  Trouble concentrating 0 0 0  Moving slowly or fidgety/restless 0 0 0  Suicidal thoughts 0 0 0  PHQ-9 Score 0 2 0  Difficult doing work/chores Not difficult at all Not difficult at all Not difficult at all     5. BMI 33.0-33.9,adult Weight is down 15lbs Wt Readings from Last 3 Encounters:  09/09/20 169 lb (76.7 kg)  03/11/20 184 lb (83.5 kg)  08/21/19 187 lb (84.8 kg)   BMI Readings from Last 3 Encounters:  09/09/20 28.12 kg/m  03/11/20 30.62 kg/m  08/21/19 31.12 kg/m       Outpatient Encounter Medications as of 09/09/2020  Medication Sig   ALPRAZolam (XANAX) 0.5 MG tablet Take 1 tablet (0.5 mg total) by mouth 3 (three) times daily.   EPINEPHRINE 0.3 mg/0.3 mL IJ SOAJ injection INJECT 0.3 MLS (0.3 MG TOTAL) INTO THE MUSCLE ONCE.   lisinopril (ZESTRIL) 10 MG tablet Take 1  tablet (10 mg total) by mouth daily.   Phentermine-Topiramate (QSYMIA) 7.5-46 MG CP24 Take 1 tablet by mouth daily.   scopolamine (TRANSDERM-SCOP) 1 MG/3DAYS Place 1 patch (1.5 mg total) onto the skin every 3 (three) days.   sertraline (ZOLOFT) 100 MG tablet 1  po qd   No facility-administered encounter medications on file as of 09/09/2020.    Past Surgical History:  Procedure Laterality Date   ABDOMINAL HYSTERECTOMY     HERNIA REPAIR      History reviewed. No pertinent family history.  New complaints: None today  Social history: Lives with her husband  Controlled substance contract: 09/09/20    Review of Systems  Constitutional:  Negative for diaphoresis.  Eyes:  Negative for pain.  Respiratory:  Negative for shortness of breath.   Cardiovascular:  Negative for chest pain, palpitations and leg swelling.  Gastrointestinal:  Negative for abdominal pain.  Endocrine: Negative for polydipsia.  Skin:  Negative for rash.  Neurological:  Negative for dizziness, weakness and headaches.  Hematological:  Does not bruise/bleed easily.  All other systems reviewed and are negative.     Objective:   Physical Exam Vitals and nursing note reviewed.  Constitutional:      General: She is not in acute distress.    Appearance: Normal  appearance. She is well-developed.  HENT:     Head: Normocephalic.     Right Ear: Tympanic membrane normal.     Left Ear: Tympanic membrane normal.     Nose: Nose normal.     Mouth/Throat:     Mouth: Mucous membranes are moist.  Eyes:     Pupils: Pupils are equal, round, and reactive to light.  Neck:     Vascular: No carotid bruit or JVD.  Cardiovascular:     Rate and Rhythm: Normal rate and regular rhythm.     Heart sounds: Normal heart sounds.  Pulmonary:     Effort: Pulmonary effort is normal. No respiratory distress.     Breath sounds: Normal breath sounds. No wheezing or rales.  Chest:     Chest wall: No tenderness.  Abdominal:      General: Bowel sounds are normal. There is no distension or abdominal bruit.     Palpations: Abdomen is soft. There is no hepatomegaly, splenomegaly, mass or pulsatile mass.     Tenderness: There is no abdominal tenderness.  Musculoskeletal:        General: Normal range of motion.     Cervical back: Normal range of motion and neck supple.  Lymphadenopathy:     Cervical: No cervical adenopathy.  Skin:    General: Skin is warm and dry.  Neurological:     Mental Status: She is alert and oriented to person, place, and time.     Deep Tendon Reflexes: Reflexes are normal and symmetric.  Psychiatric:        Behavior: Behavior normal.        Thought Content: Thought content normal.        Judgment: Judgment normal.    BP 121/76   Pulse 85   Temp (!) 97.5 F (36.4 C) (Temporal)   Resp 20   Ht '5\' 5"'  (1.651 m)   Wt 169 lb (76.7 kg)   BMI 28.12 kg/m        Assessment & Plan:   Gabriela Mccormick comes in today with chief complaint of Medical Management of Chronic Issues   Diagnosis and orders addressed:  1. Primary hypertension Low sodium diet - lisinopril (ZESTRIL) 10 MG tablet; Take 1 tablet (10 mg total) by mouth daily.  Dispense: 90 tablet; Refill: 1 - CBC with Differential/Platelet - CMP14+EGFR  2. Hyperlipidemia with target LDL less than 100 Low fat diet - Lipid panel  3. Gastroesophageal reflux disease without esophagitis Avoid spicy foods Do not eat 2 hours prior to bedtime  4. Recurrent major depressive disorder, in full remission ([place]) Stress management - ALPRAZolam (XANAX) 0.5 MG tablet; Take 1 tablet (0.5 mg total) by mouth 3 (three) times daily.  Dispense: 90 tablet; Refill: 5 - sertraline (ZOLOFT) 100 MG tablet; 1  po qd  Dispense: 90 tablet; Refill: 1  6. BMI 30.0-30.9,adult Discussed diet and exercise for person with BMI >25 Will recheck weight in 3-6 months - Phentermine-Topiramate (QSYMIA) 7.5-46 MG CP24; Take 1 tablet by mouth daily.  Dispense: 30  capsule; Refill: 5   Labs pending Health Maintenance reviewed Diet and exercise encouraged  Follow up plan: 6 months   [name], FNP

## 2020-09-09 NOTE — Patient Instructions (Signed)
Exercising to Lose Weight Exercise is structured, repetitive physical activity to improve fitness and health. Getting regular exercise is important for everyone. It is especially important if you are overweight. Being overweight increases your risk of heart disease, stroke, diabetes, high blood pressure, and several types of cancer.Reducing your calorie intake and exercising can help you lose weight. Exercise is usually categorized as moderate or vigorous intensity. To lose weight, most people need to do a certain amount of moderate-intensity orvigorous-intensity exercise each week. Moderate-intensity exercise  Moderate-intensity exercise is any activity that gets you moving enough to burn at least three times more energy (calories) than if you were sitting. Examples of moderate exercise include: Walking a mile in 15 minutes. Doing light yard work. Biking at an easy pace. Most people should get at least 150 minutes (2 hours and 30 minutes) a week ofmoderate-intensity exercise to maintain their body weight. Vigorous-intensity exercise Vigorous-intensity exercise is any activity that gets you moving enough to burn at least six times more calories than if you were sitting. When you exercise at this intensity, you should be working hard enough that you are not able tocarry on a conversation. Examples of vigorous exercise include: Running. Playing a team sport, such as football, basketball, and soccer. Jumping rope. Most people should get at least 75 minutes (1 hour and 15 minutes) a week ofvigorous-intensity exercise to maintain their body weight. How can exercise affect me? When you exercise enough to burn more calories than you eat, you lose weight. Exercise also reduces body fat and builds muscle. The more muscle you have, the more calories you burn. Exercise also: Improves mood. Reduces stress and tension. Improves your overall fitness, flexibility, and endurance. Increases bone strength. The  amount of exercise you need to lose weight depends on: Your age. The type of exercise. Any health conditions you have. Your overall physical ability. Talk to your health care provider about how much exercise you need and whattypes of activities are safe for you. What actions can I take to lose weight? Nutrition  Make changes to your diet as told by your health care provider or diet and nutrition specialist (dietitian). This may include: Eating fewer calories. Eating more protein. Eating less unhealthy fats. Eating a diet that includes fresh fruits and vegetables, whole grains, low-fat dairy products, and lean protein. Avoiding foods with added fat, salt, and sugar. Drink plenty of water while you exercise to prevent dehydration or heat stroke.  Activity Choose an activity that you enjoy and set realistic goals. Your health care provider can help you make an exercise plan that works for you. Exercise at a moderate or vigorous intensity most days of the week. The intensity of exercise may vary from person to person. You can tell how intense a workout is for you by paying attention to your breathing and heartbeat. Most people will notice their breathing and heartbeat get faster with more intense exercise. Do resistance training twice each week, such as: Push-ups. Sit-ups. Lifting weights. Using resistance bands. Getting short amounts of exercise can be just as helpful as long structured periods of exercise. If you have trouble finding time to exercise, try to include exercise in your daily routine. Get up, stretch, and walk around every 30 minutes throughout the day. Go for a walk during your lunch break. Park your car farther away from your destination. If you take public transportation, get off one stop early and walk the rest of the way. Make phone calls while standing up and   walking around. Take the stairs instead of elevators or escalators. Wear comfortable clothes and shoes with  good support. Do not exercise so much that you hurt yourself, feel dizzy, or get very short of breath. Where to find more information [place] Department of Health and Human Services: www.hhs.gov Centers for Disease Control and Prevention (CDC): www.cdc.gov Contact a health care provider: Before starting a new exercise program. If you have questions or concerns about your weight. If you have a medical problem that keeps you from exercising. Get help right away if you have any of the following while exercising: Injury. Dizziness. Difficulty breathing or shortness of breath that does not go away when you stop exercising. Chest pain. Rapid heartbeat. Summary Being overweight increases your risk of heart disease, stroke, diabetes, high blood pressure, and several types of cancer. Losing weight happens when you burn more calories than you eat. Reducing the amount of calories you eat in addition to getting regular moderate or vigorous exercise each week helps you lose weight. This information is not intended to replace advice given to you by your health care provider. Make sure you discuss any questions you have with your healthcare provider. Document Revised: 04/14/2019 Document Reviewed: 05/01/2019 Elsevier Patient Education  2022 [employer] Inc.  

## 2020-11-05 DIAGNOSIS — H16223 Keratoconjunctivitis sicca, not specified as Sjogren's, bilateral: Secondary | ICD-10-CM | POA: Diagnosis not present

## 2021-03-04 ENCOUNTER — Ambulatory Visit: Payer: BC Managed Care – PPO | Admitting: Nurse Practitioner

## 2021-03-21 ENCOUNTER — Other Ambulatory Visit: Payer: Self-pay | Admitting: Nurse Practitioner

## 2021-03-21 DIAGNOSIS — Z6828 Body mass index (BMI) 28.0-28.9, adult: Secondary | ICD-10-CM

## 2021-03-25 ENCOUNTER — Ambulatory Visit (INDEPENDENT_AMBULATORY_CARE_PROVIDER_SITE_OTHER): Payer: BC Managed Care – PPO | Admitting: Nurse Practitioner

## 2021-03-25 ENCOUNTER — Encounter: Payer: Self-pay | Admitting: Nurse Practitioner

## 2021-03-25 VITALS — BP 126/76 | HR 95 | Temp 97.7°F | Ht 65.0 in | Wt 168.2 lb

## 2021-03-25 DIAGNOSIS — K219 Gastro-esophageal reflux disease without esophagitis: Secondary | ICD-10-CM

## 2021-03-25 DIAGNOSIS — Z23 Encounter for immunization: Secondary | ICD-10-CM

## 2021-03-25 DIAGNOSIS — E785 Hyperlipidemia, unspecified: Secondary | ICD-10-CM | POA: Diagnosis not present

## 2021-03-25 DIAGNOSIS — I10 Essential (primary) hypertension: Secondary | ICD-10-CM

## 2021-03-25 DIAGNOSIS — Z6828 Body mass index (BMI) 28.0-28.9, adult: Secondary | ICD-10-CM

## 2021-03-25 DIAGNOSIS — Z6830 Body mass index (BMI) 30.0-30.9, adult: Secondary | ICD-10-CM

## 2021-03-25 DIAGNOSIS — F3342 Major depressive disorder, recurrent, in full remission: Secondary | ICD-10-CM

## 2021-03-25 MED ORDER — QSYMIA 7.5-46 MG PO CP24: 1.0000 | ORAL_CAPSULE | Freq: Every day | ORAL | 5 refills | Status: DC

## 2021-03-25 MED ORDER — ALPRAZOLAM 0.5 MG PO TABS: 0.5000 mg | ORAL_TABLET | Freq: Three times a day (TID) | ORAL | 5 refills | Status: DC

## 2021-03-25 MED ORDER — SERTRALINE HCL 100 MG PO TABS: ORAL_TABLET | ORAL | 1 refills | Status: DC

## 2021-03-25 MED ORDER — LISINOPRIL 10 MG PO TABS: 10.0000 mg | ORAL_TABLET | Freq: Every day | ORAL | 1 refills | Status: DC

## 2021-03-25 NOTE — Progress Notes (Signed)
? ?Subjective:  ? ? Patient ID: Gabriela Mccormick, female    DOB: 02/18/1965, 56 y.o.   MRN: [id number] ? ? ?Chief Complaint: medical management of chronic issues  ?  ? ?HPI: ? ?Gabriela Mccormick is a 56 y.o. who identifies as a female who was assigned female at birth.  ? ?Social history: ?Lives with: husband and sons ? ? ?Comes in today for follow up of the following chronic medical issues: ? ?1. Primary hypertension ?No c/o chest pain, sob or headache. Does not check blood pressure at home. ?BP Readings from Last 3 Encounters:  ?09/09/20 121/76  ?03/11/20 130/74  ?08/21/19 119/80  ? ? ? ?2. Hyperlipidemia with target LDL less than 100 ?Does try to wathc diet but does little to no dedicated exercise. ?Lab Results  ?Component Value Date  ? CHOL 260 (H) 03/11/2020  ? HDL 66 03/11/2020  ? LDLCALC 143 (H) 03/11/2020  ? TRIG 283 (H) 03/11/2020  ? CHOLHDL 3.9 03/11/2020  ?The 10-year ASCVD risk score (Arnett DK, et al., 2019) is: 2.9% ? ? ? ?3. Gastroesophageal reflux disease without esophagitis ?Denies any recent symptoms ? ?4. Recurrent major depressive disorder, in full remission ([place]) ?She is on zoloft daily and says she is doing well. ?Depression screen [facility] 2/9 09/09/2020 03/11/2020 08/21/2019  ?Decreased Interest 0 1 0  ?Down, Depressed, Hopeless 0 1 0  ?PHQ - 2 Score 0 2 0  ?Altered sleeping 0 0 0  ?Tired, decreased energy 0 0 0  ?Change in appetite 0 0 0  ?Feeling bad or failure about yourself  0 0 0  ?Trouble concentrating 0 0 0  ?Moving slowly or fidgety/restless 0 0 0  ?Suicidal thoughts 0 0 0  ?PHQ-9 Score 0 2 0  ?Difficult doing work/chores Not difficult at all Not difficult at all Not difficult at all  ? ? ? ?5. BMI 30.0-30.9,adult ?No recent weight changes ?Wt Readings from Last 3 Encounters:  ?03/25/21 168 lb 3.2 oz (76.3 kg)  ?09/09/20 169 lb (76.7 kg)  ?03/11/20 184 lb (83.5 kg)  ? ?BMI Readings from Last 3 Encounters:  ?03/25/21 27.99 kg/m?  ?09/09/20 28.12 kg/m?  ?03/11/20 30.62 kg/m?  ? ? ? ? ?New  complaints: ?None today ? ?Allergies  ?Allergen Reactions  ? Bee Venom Anaphylaxis and Swelling  ? Enoxaparin Hives  ? ?Outpatient Encounter Medications as of 03/25/2021  ?Medication Sig  ? ALPRAZolam (XANAX) 0.5 MG tablet Take 1 tablet (0.5 mg total) by mouth 3 (three) times daily.  ? EPINEPHRINE 0.3 mg/0.3 mL IJ SOAJ injection INJECT 0.3 MLS (0.3 MG TOTAL) INTO THE MUSCLE ONCE.  ? lisinopril (ZESTRIL) 10 MG tablet Take 1 tablet (10 mg total) by mouth daily.  ? Phentermine-Topiramate (QSYMIA) 7.5-46 MG CP24 Take 1 tablet by mouth daily.  ? sertraline (ZOLOFT) 100 MG tablet 1  po qd  ? ?No facility-administered encounter medications on file as of 03/25/2021.  ? ? ?Past Surgical History:  ?Procedure Laterality Date  ? ABDOMINAL HYSTERECTOMY    ? HERNIA REPAIR    ? ? ?No family history on file. ? ? ? ?Controlled substance contract: 09/14/20 ? ? ? ? ?Review of Systems  ?Constitutional:  Negative for diaphoresis.  ?Eyes:  Negative for pain.  ?Respiratory:  Negative for shortness of breath.   ?Cardiovascular:  Negative for chest pain, palpitations and leg swelling.  ?Gastrointestinal:  Negative for abdominal pain.  ?Endocrine: Negative for polydipsia.  ?Skin:  Negative for rash.  ?Neurological:  Negative for dizziness, weakness and  headaches.  ?Hematological:  Does not bruise/bleed easily.  ?All other systems reviewed and are negative. ? ?   ?Objective:  ? Physical Exam ?Vitals and nursing note reviewed.  ?Constitutional:   ?   General: She is not in acute distress. ?   Appearance: Normal appearance. She is well-developed.  ?HENT:  ?   Head: Normocephalic.  ?   Right Ear: Tympanic membrane normal.  ?   Left Ear: Tympanic membrane normal.  ?   Nose: Nose normal.  ?   Mouth/Throat:  ?   Mouth: Mucous membranes are moist.  ?Eyes:  ?   Pupils: Pupils are equal, round, and reactive to light.  ?Neck:  ?   Vascular: No carotid bruit or JVD.  ?Cardiovascular:  ?   Rate and Rhythm: Normal rate and regular rhythm.  ?   Heart sounds:  Normal heart sounds.  ?Pulmonary:  ?   Effort: Pulmonary effort is normal. No respiratory distress.  ?   Breath sounds: Normal breath sounds. No wheezing or rales.  ?Chest:  ?   Chest wall: No tenderness.  ?Abdominal:  ?   General: Bowel sounds are normal. There is no distension or abdominal bruit.  ?   Palpations: Abdomen is soft. There is no hepatomegaly, splenomegaly, mass or pulsatile mass.  ?   Tenderness: There is no abdominal tenderness.  ?Musculoskeletal:     ?   General: Normal range of motion.  ?   Cervical back: Normal range of motion and neck supple.  ?Lymphadenopathy:  ?   Cervical: No cervical adenopathy.  ?Skin: ?   General: Skin is warm and dry.  ?Neurological:  ?   Mental Status: She is alert and oriented to person, place, and time.  ?   Deep Tendon Reflexes: Reflexes are normal and symmetric.  ?Psychiatric:     ?   Behavior: Behavior normal.     ?   Thought Content: Thought content normal.     ?   Judgment: Judgment normal.  ? ? ?BP 126/76   Pulse 95   Temp 97.7 ?F (36.5 ?C) (Temporal)   Ht '5\' 5"'$  (1.651 m)   Wt 168 lb 3.2 oz (76.3 kg)   SpO2 100%   BMI 27.99 kg/m?  ? ? ? ?   ?Assessment & Plan:  ?Gabriela Mccormick comes in today with chief complaint of Medication Management ? ? ?Diagnosis and orders addressed: ? ?1. Primary hypertension ?Low sodium diet ?- lisinopril (ZESTRIL) 10 MG tablet; Take 1 tablet (10 mg total) by mouth daily.  Dispense: 90 tablet; Refill: 1 ? ?2. Hyperlipidemia with target LDL less than 100 ?Low fat diet ? ?3. Gastroesophageal reflux disease without esophagitis ?Avoid spicy foods ?Do not eat 2 hours prior to bedtime ? ? ?4. Recurrent major depressive disorder, in full remission ([place]) ?Stress management ?- ALPRAZolam (XANAX) 0.5 MG tablet; Take 1 tablet (0.5 mg total) by mouth 3 (three) times daily.  Dispense: 90 tablet; Refill: 5 ?- sertraline (ZOLOFT) 100 MG tablet; 1  po qd  Dispense: 90 tablet; Refill: 1 ? ?5. BMI 30.0-30.9,adult ?Discussed diet and exercise for  person with BMI >25 ?Will recheck weight in 3-6 months ? ? ? ?Labs pending ?Health Maintenance reviewed ?Diet and exercise encouraged ? ?Follow up plan: ?6 month ? ? ?[name], FNP ? ? ?

## 2021-03-25 NOTE — Patient Instructions (Signed)
Exercising to Stay Healthy °To become healthy and stay healthy, it is recommended that you do moderate-intensity and vigorous-intensity exercise. You can tell that you are exercising at a moderate intensity if your heart starts beating faster and you start breathing faster but can still hold a conversation. You can tell that you are exercising at a vigorous intensity if you are breathing much harder and faster and cannot hold a conversation while exercising. °How can exercise benefit me? °Exercising regularly is important. It has many health benefits, such as: °Improving overall fitness, flexibility, and endurance. °Increasing bone density. °Helping with weight control. °Decreasing body fat. °Increasing muscle strength and endurance. °Reducing stress and tension, anxiety, depression, or anger. °Improving overall health. °What guidelines should I follow while exercising? °Before you start a new exercise program, talk with your health care provider. °Do not exercise so much that you hurt yourself, feel dizzy, or get very short of breath. °Wear comfortable clothes and wear shoes with good support. °Drink plenty of water while you exercise to prevent dehydration or heat stroke. °Work out until your breathing and your heartbeat get faster (moderate intensity). °How often should I exercise? °Choose an activity that you enjoy, and set realistic goals. Your health care provider can help you make an activity plan that is individually designed and works best for you. °Exercise regularly as told by your health care provider. This may include: °Doing strength training two times a week, such as: °Lifting weights. °Using resistance bands. °Push-ups. °Sit-ups. °Yoga. °Doing a certain intensity of exercise for a given amount of time. Choose from these options: °A total of 150 minutes of moderate-intensity exercise every week. °A total of 75 minutes of vigorous-intensity exercise every week. °A mix of moderate-intensity and  vigorous-intensity exercise every week. °Children, pregnant women, people who have not exercised regularly, people who are overweight, and older adults may need to talk with a health care provider about what activities are safe to perform. If you have a medical condition, be sure to talk with your health care provider before you start a new exercise program. °What are some exercise ideas? °Moderate-intensity exercise ideas include: °Walking 1 mile (1.6 km) in about 15 minutes. °Biking. °Hiking. °Golfing. °Dancing. °Water aerobics. °Vigorous-intensity exercise ideas include: °Walking 4.5 miles (7.2 km) or more in about 1 hour. °Jogging or running 5 miles (8 km) in about 1 hour. °Biking 10 miles (16.1 km) or more in about 1 hour. °Lap swimming. °Roller-skating or in-line skating. °Cross-country skiing. °Vigorous competitive sports, such as football, basketball, and soccer. °Jumping rope. °Aerobic dancing. °What are some everyday activities that can help me get exercise? °Yard work, such as: °Pushing a lawn mower. °Raking and bagging leaves. °Washing your car. °Pushing a stroller. °Shoveling snow. °Gardening. °Washing windows or floors. °How can I be more active in my day-to-day activities? °Use stairs instead of an elevator. °Take a walk during your lunch break. °If you drive, park your car farther away from your work or school. °If you take public transportation, get off one stop early and walk the rest of the way. °Stand up or walk around during all of your indoor phone calls. °Get up, stretch, and walk around every 30 minutes throughout the day. °Enjoy exercise with a friend. Support to continue exercising will help you keep a regular routine of activity. °Where to find more information °You can find more information about exercising to stay healthy from: °U.S[place] Department of Health and Human Services: www.hhs.gov °Centers for Disease Control and Prevention (  CDC): www.cdc.gov °Summary °Exercising regularly is  important. It will improve your overall fitness, flexibility, and endurance. °Regular exercise will also improve your overall health. It can help you control your weight, reduce stress, and improve your bone density. °Do not exercise so much that you hurt yourself, feel dizzy, or get very short of breath. °Before you start a new exercise program, talk with your health care provider. °This information is not intended to replace advice given to you by your health care provider. Make sure you discuss any questions you have with your health care provider. °Document Revised: 04/30/2020 Document Reviewed: 04/30/2020 °Elsevier Patient Education © 2022 [employer] ° °

## 2021-03-25 NOTE — Addendum Note (Signed)
Addended by: Colman Cater on: 03/25/2021 04:03 PM ? ? Modules accepted: Orders ? ?

## 2021-03-25 NOTE — Addendum Note (Signed)
Addended by: Chevis Pretty on: 03/25/2021 03:58 PM ? ? Modules accepted: Orders ? ?

## 2021-03-26 LAB — LIPID PANEL
Chol/HDL Ratio: 3.2 ratio (ref 0.0–4.4)
Cholesterol, Total: 233 mg/dL — ABNORMAL HIGH (ref 100–199)
HDL: 72 mg/dL (ref >39)
LDL Chol Calc (NIH): 105 mg/dL — ABNORMAL HIGH (ref 0–99)
Triglycerides: 331 mg/dL — ABNORMAL HIGH (ref 0–149)
VLDL Cholesterol Cal: 56 mg/dL — ABNORMAL HIGH (ref 5–40)

## 2021-03-26 LAB — CBC WITH DIFFERENTIAL/PLATELET
Basophils Absolute: 0.1 10*3/uL (ref 0.0–0.2)
Basos: 1 %
EOS (ABSOLUTE): 0.1 10*3/uL (ref 0.0–0.4)
Eos: 1 %
Hematocrit: 39 % (ref 34.0–46.6)
Hemoglobin: 13.1 g/dL (ref 11.1–15.9)
Immature Grans (Abs): 0 10*3/uL (ref 0.0–0.1)
Immature Granulocytes: 0 %
Lymphocytes Absolute: 2.9 10*3/uL (ref 0.7–3.1)
Lymphs: 35 %
MCH: 31.4 pg (ref 26.6–33.0)
MCHC: 33.6 g/dL (ref 31.5–35.7)
MCV: 94 fL (ref 79–97)
Monocytes Absolute: 0.6 10*3/uL (ref 0.1–0.9)
Monocytes: 7 %
Neutrophils Absolute: 4.6 10*3/uL (ref 1.4–7.0)
Neutrophils: 56 %
Platelets: 214 10*3/uL (ref 150–450)
RBC: 4.17 x10E6/uL (ref 3.77–5.28)
RDW: 11.3 % — ABNORMAL LOW (ref 11.7–15.4)
WBC: 8.2 10*3/uL (ref 3.4–10.8)

## 2021-03-26 LAB — CMP14+EGFR
ALT: 26 IU/L (ref 0–32)
AST: 30 IU/L (ref 0–40)
Albumin/Globulin Ratio: 1.8 (ref 1.2–2.2)
Albumin: 4.7 g/dL (ref 3.8–4.9)
Alkaline Phosphatase: 89 IU/L (ref 44–121)
BUN/Creatinine Ratio: 21 (ref 9–23)
BUN: 16 mg/dL (ref 6–24)
Bilirubin Total: 0.4 mg/dL (ref 0.0–1.2)
CO2: 20 mmol/L (ref 20–29)
Calcium: 9.1 mg/dL (ref 8.7–10.2)
Chloride: 105 mmol/L (ref 96–106)
Creatinine, Ser: 0.78 mg/dL (ref 0.57–1.00)
Globulin, Total: 2.6 g/dL (ref 1.5–4.5)
Glucose: 99 mg/dL (ref 70–99)
Potassium: 4.3 mmol/L (ref 3.5–5.2)
Sodium: 143 mmol/L (ref 134–144)
Total Protein: 7.3 g/dL (ref 6.0–8.5)
eGFR: 90 mL/min/{1.73_m2} (ref >59)

## 2021-06-03 ENCOUNTER — Ambulatory Visit: Payer: BC Managed Care – PPO | Admitting: Family Medicine

## 2021-06-22 DIAGNOSIS — M542 Cervicalgia: Secondary | ICD-10-CM | POA: Diagnosis not present

## 2021-07-01 DIAGNOSIS — M5413 Radiculopathy, cervicothoracic region: Secondary | ICD-10-CM | POA: Diagnosis not present

## 2021-07-01 DIAGNOSIS — M5013 Cervical disc disorder with radiculopathy, cervicothoracic region: Secondary | ICD-10-CM | POA: Diagnosis not present

## 2021-07-21 ENCOUNTER — Encounter: Payer: Self-pay | Admitting: *Deleted

## 2021-08-08 IMAGING — MG DIGITAL SCREENING BILAT W/ TOMO W/ CAD
8 series · 9 of 24 positions shown · non-contrast
Comparison: None.

CLINICAL DATA: Screening.

EXAM:
DIGITAL SCREENING BILATERAL MAMMOGRAM WITH TOMO AND CAD

[R CC synth-2D]
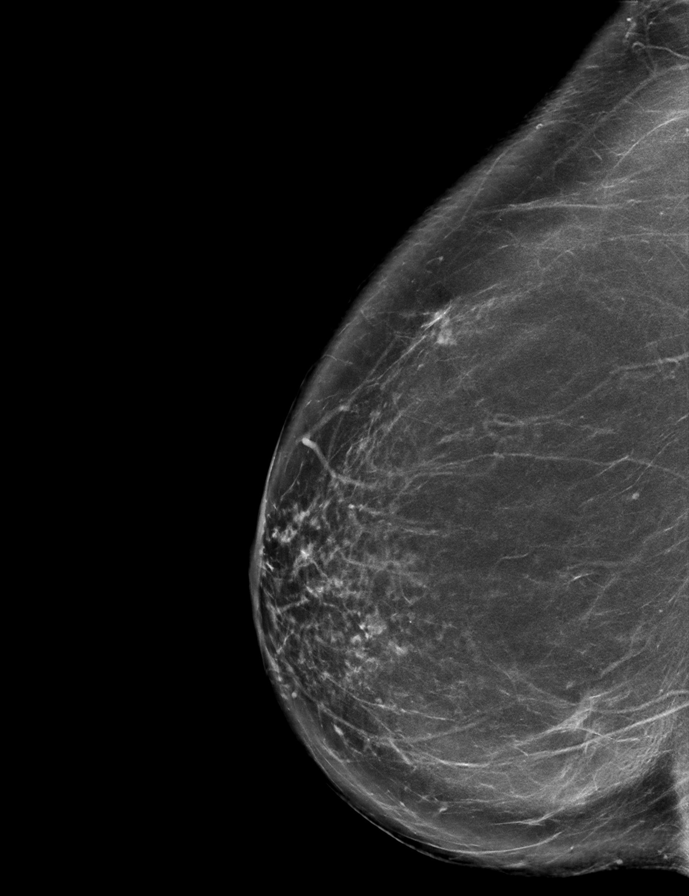

[R MLO synth-2D]
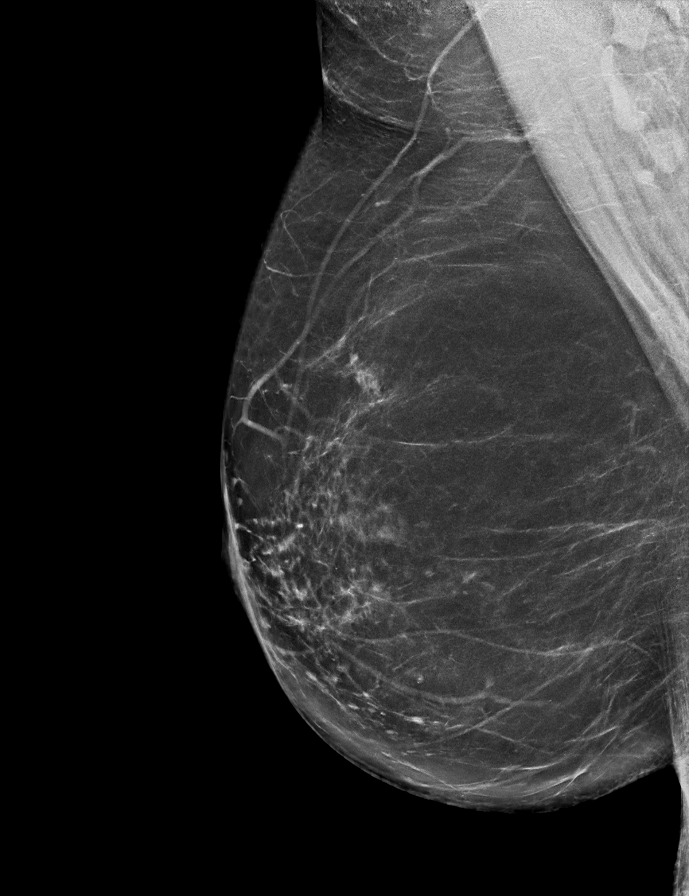

[L CC synth-2D]
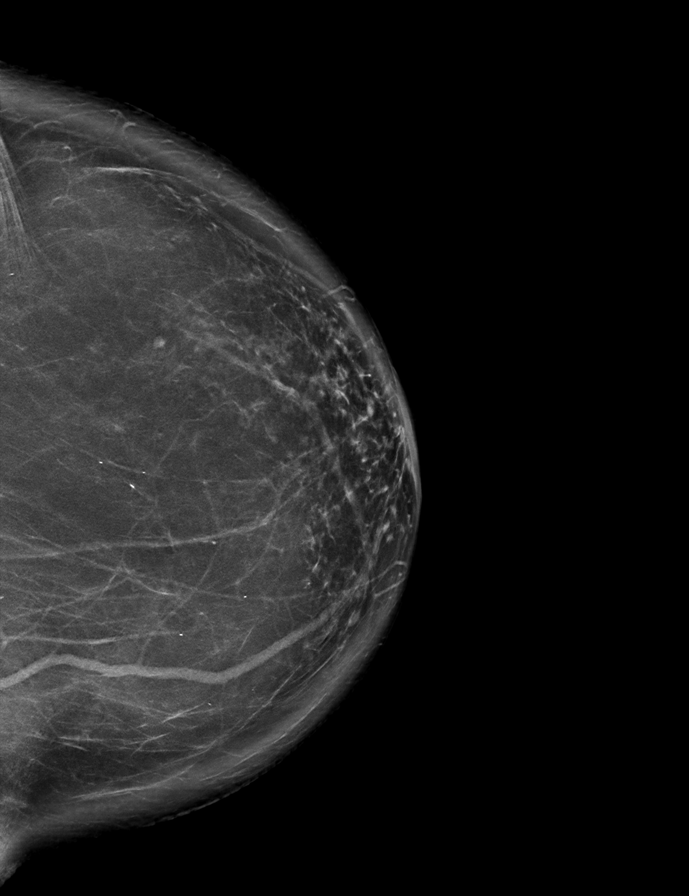

[L MLO synth-2D]
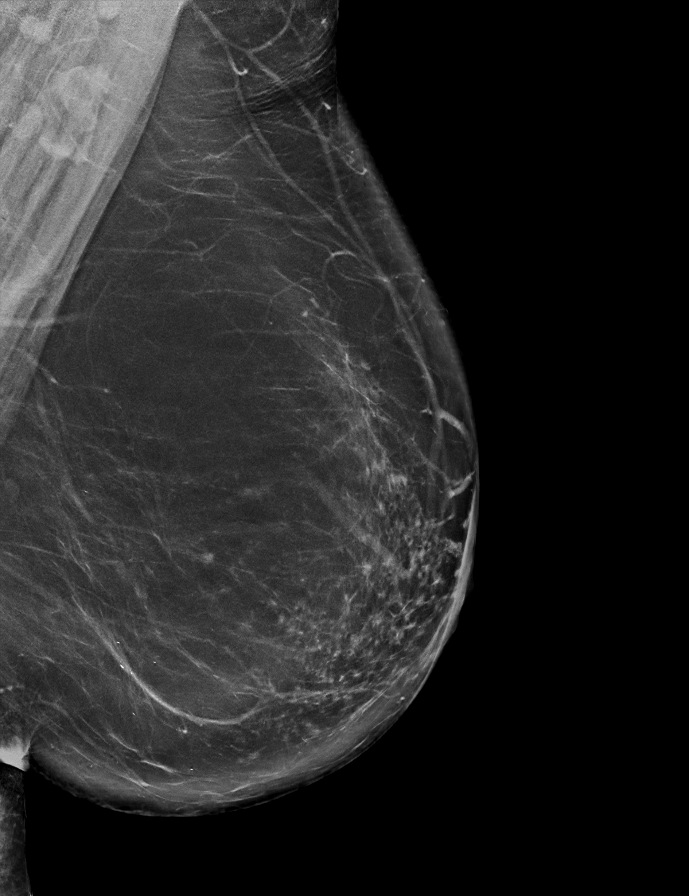

[L CC tomo · 2 of 86 frames shown]
[frame 28/86]
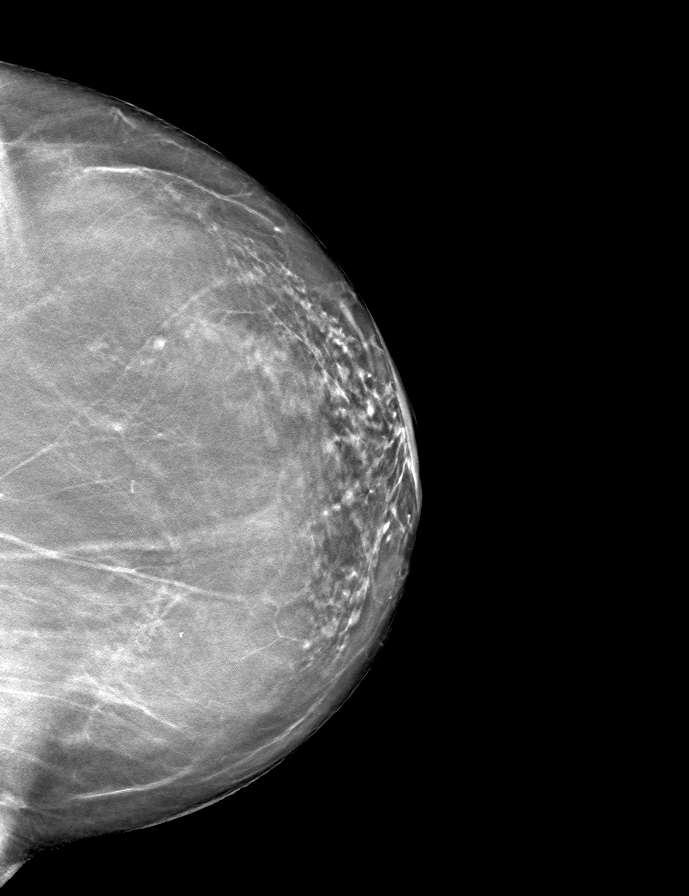
[frame 43/86]
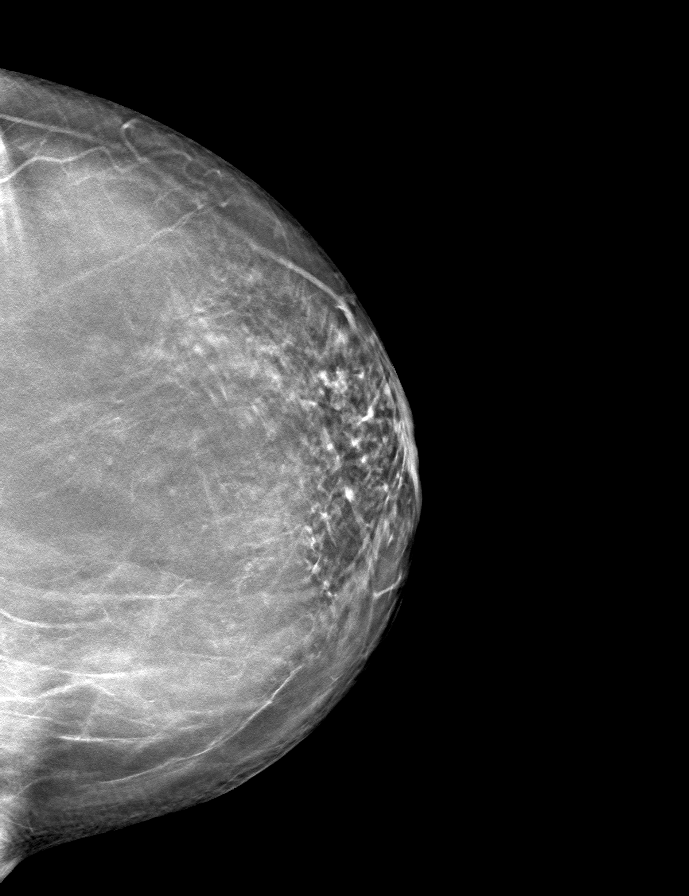

[L MLO tomo · tomo slice 45/89.0]
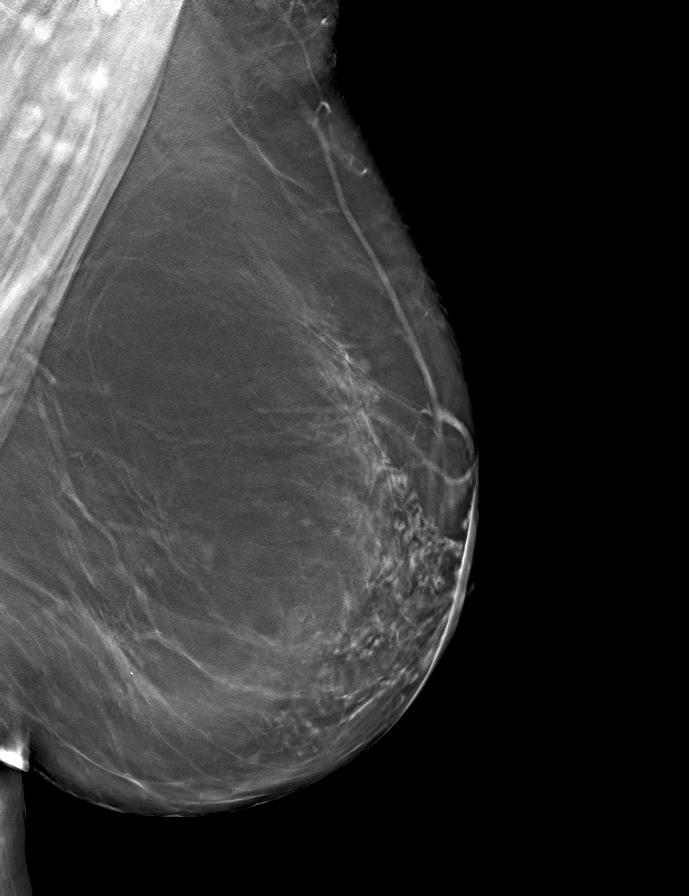

[R MLO tomo · tomo slice 43/85.0]
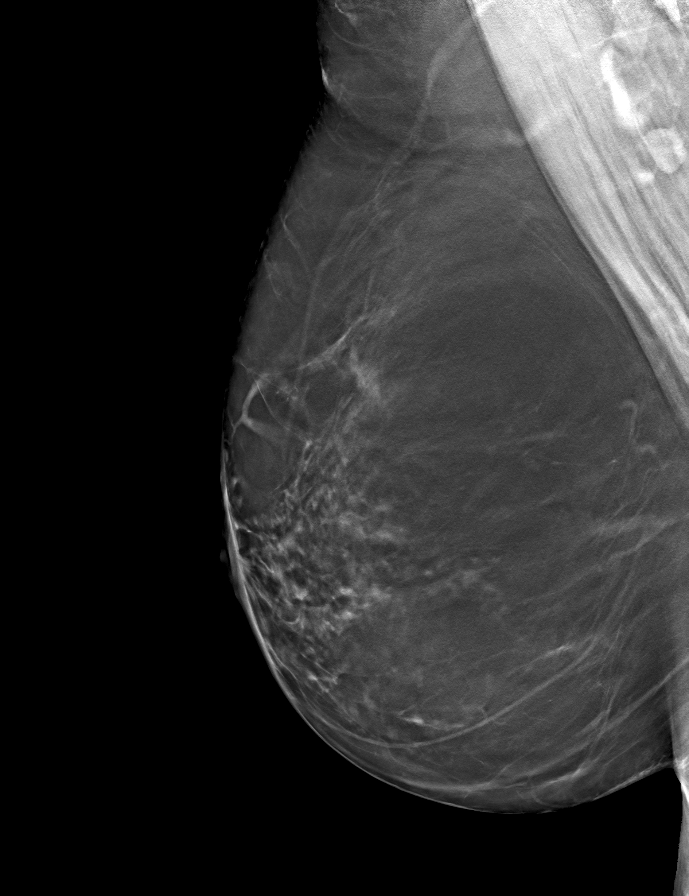

[R CC tomo · tomo slice 42/83.0]
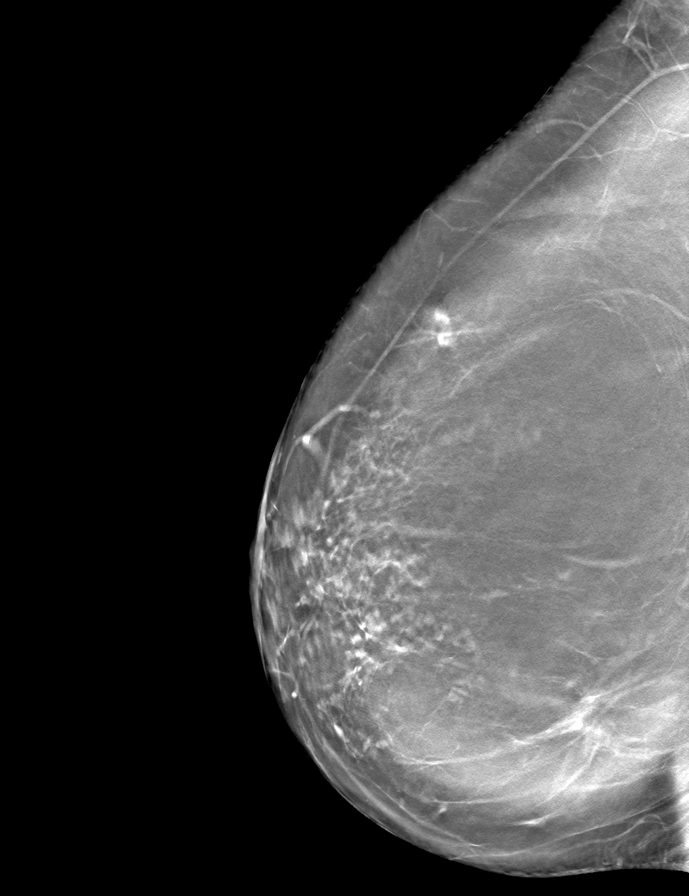

[9 of 24 positions shown; findings below may reference images not displayed]

ACR Breast Density Category b: There are scattered areas of
fibroglandular density.
FINDINGS: In the right breast, a possible asymmetry warrants further
evaluation. This possible asymmetry is seen within the upper-outer
quadrant, cc slice 60 and MLO slice 61.

In the left breast, no findings suspicious for malignancy. Images
were processed with CAD.
IMPRESSION: Further evaluation is suggested for possible asymmetry in the right
breast.

RECOMMENDATION:
Diagnostic mammogram and possibly ultrasound of the right breast.
(Code:[id number]-887)

The patient will be contacted regarding the findings, and additional
imaging will be scheduled.

BI-RADS CATEGORY  0: Incomplete. Need additional imaging evaluation
and/or prior mammograms for comparison.

## 2021-08-14 IMAGING — US US BREAST*R* LIMITED INC AXILLA
1 series · 9 of 9 positions shown · non-contrast
Comparison: Previous exam(s).

CLINICAL DATA: 54-year-old female recalled from baseline screening
mammogram dated 07/16/2019 for a possible right breast asymmetry.

EXAM:
DIGITAL DIAGNOSTIC RIGHT MAMMOGRAM WITH CAD AND TOMO
ULTRASOUND RIGHT BREAST

[Series 1: us breast*right* limited inc axilla · 0.07mm/px · 9 of 9 slices shown]
[im 1/9]
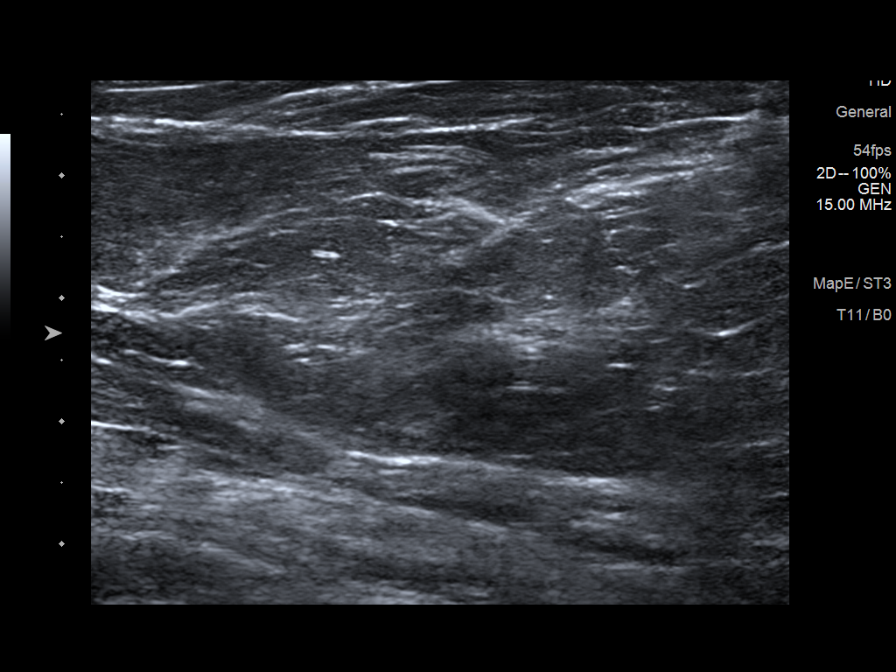
[im 2/9]
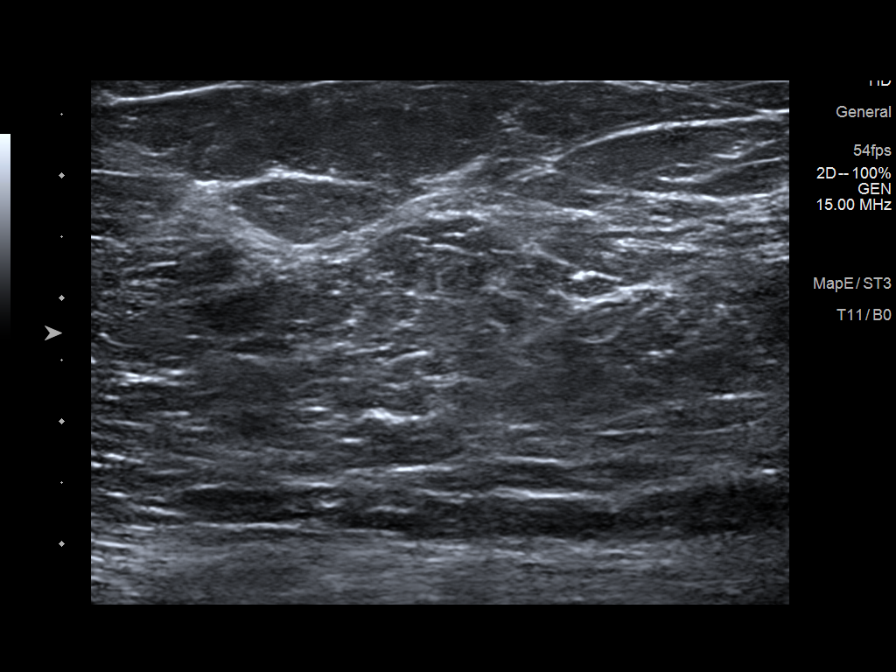
[im 3/9]
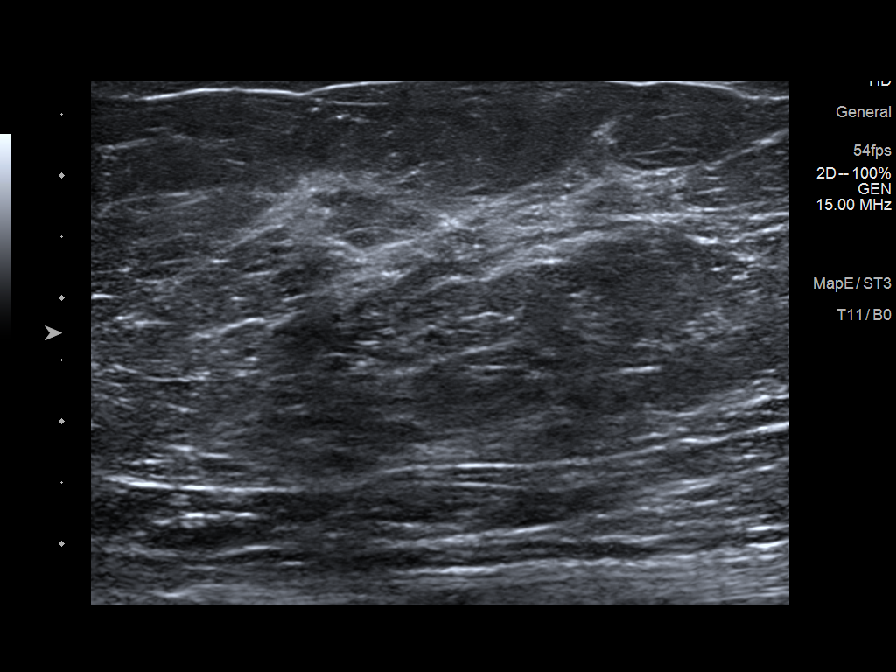
[im 4/9]
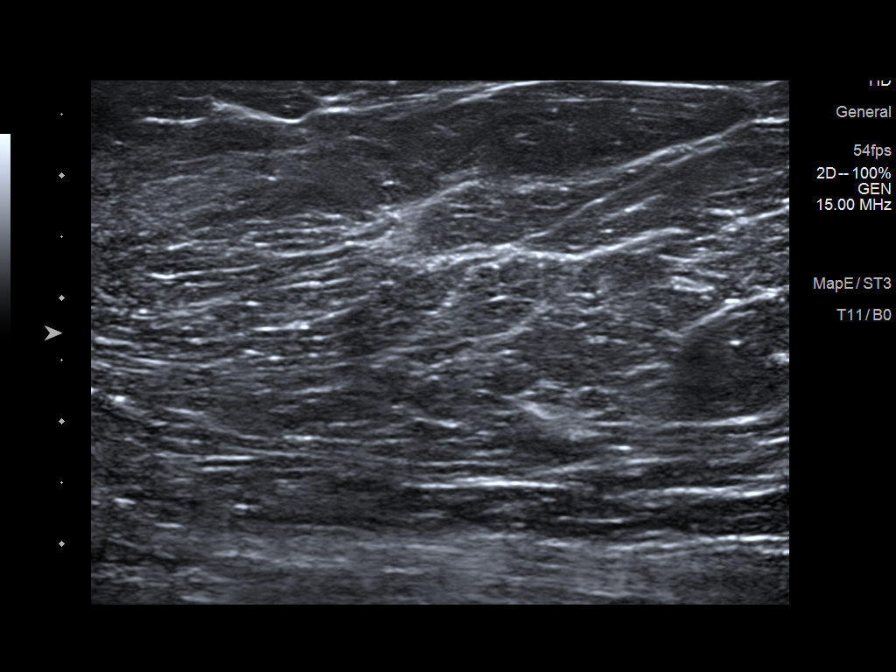
[im 5/9]
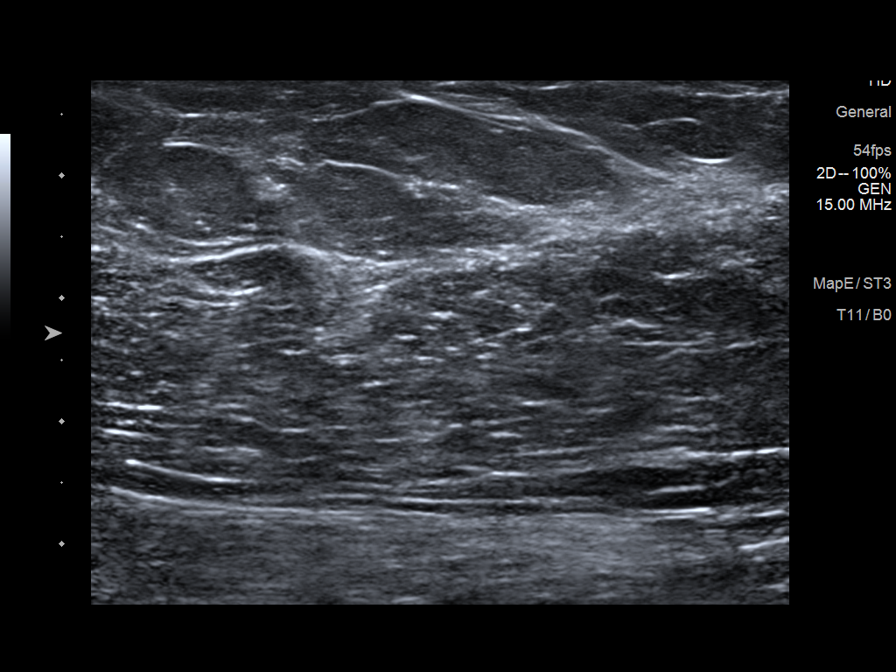
[im 6/9]
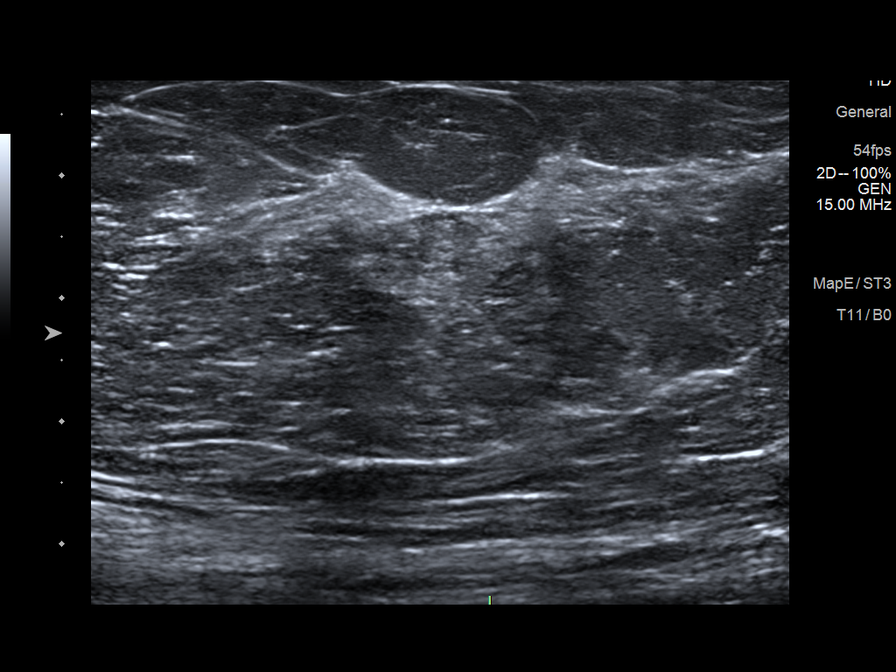
[im 7/9]
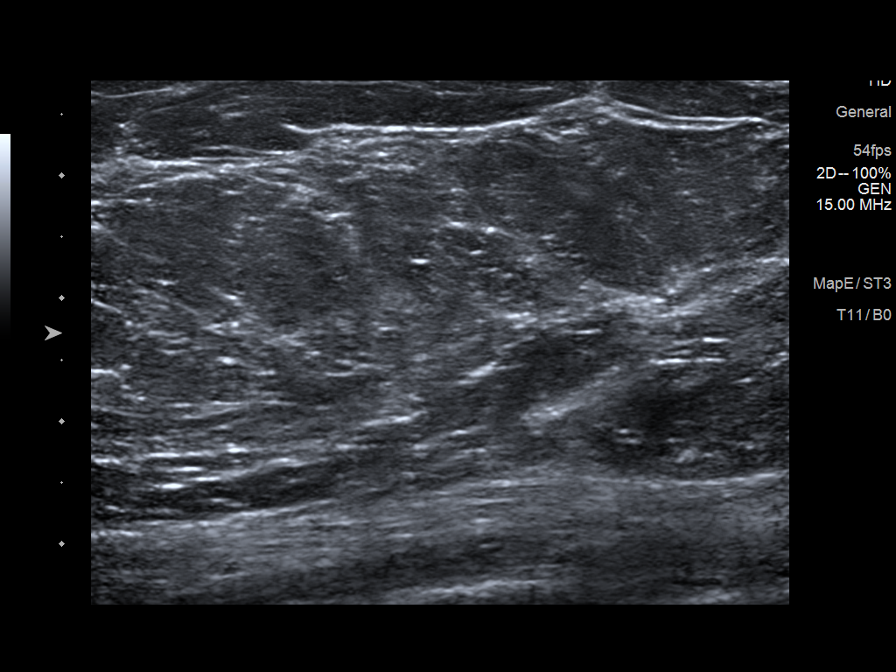
[im 8/9]
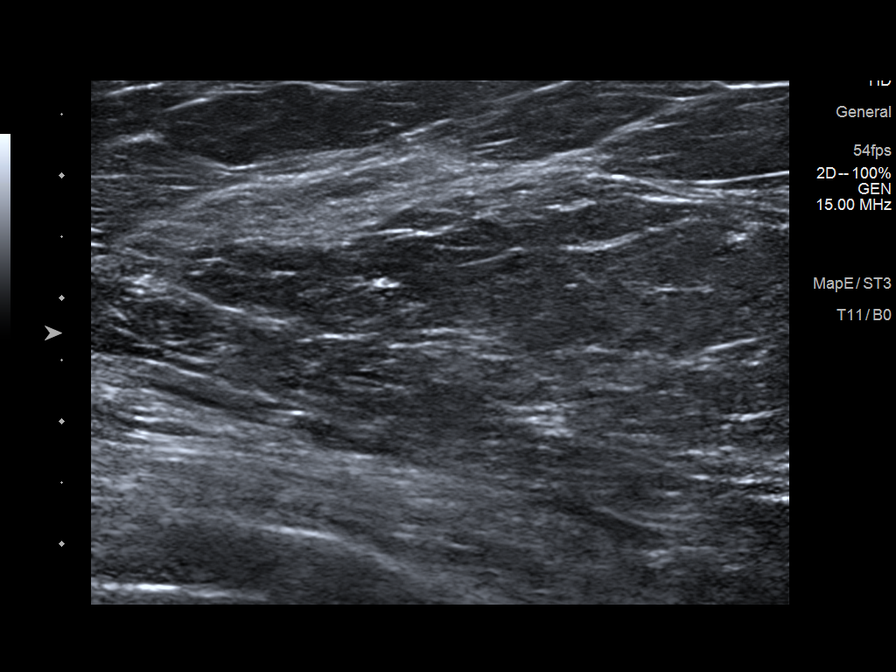
[im 9/9]
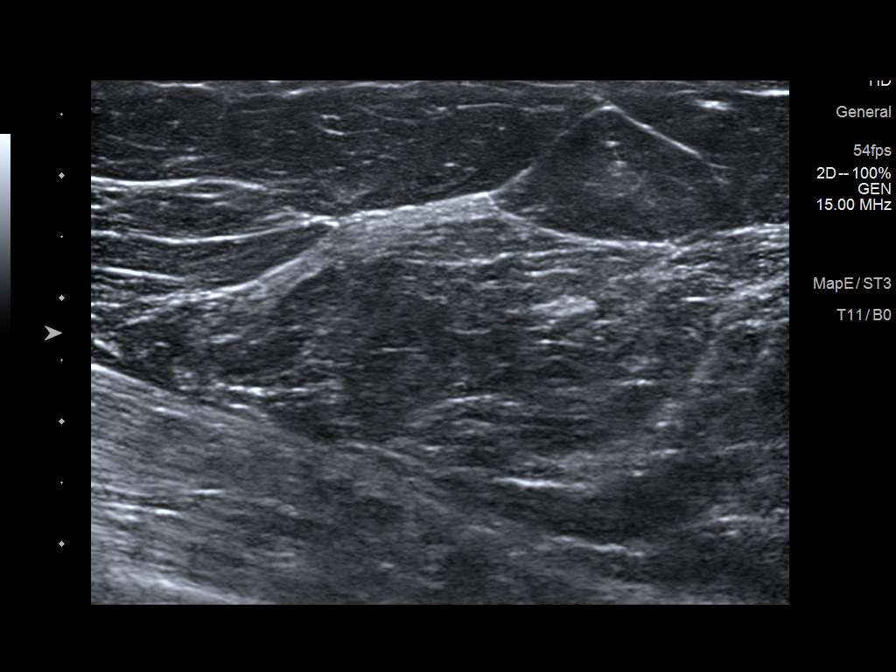

[9 of 9 positions shown; findings below may reference images not displayed]

ACR Breast Density Category b: There are scattered areas of
fibroglandular density.
FINDINGS: Previously described, focal asymmetry in the upper outer right
breast at middle depth partially effaces on today's additional
views. The appearance is suggestive of an island of focal
fibroglandular tissue. Further evaluation with ultrasound was
performed.

Mammographic images were processed with CAD.

Targeted ultrasound is performed, showing normal fibroglandular
tissue without focal or suspicious sonographic findings in the
upper-outer quadrant of the right breast.
IMPRESSION: Probably benign left breast asymmetry without sonographic correlate.
Recommendation is for short-term imaging follow-up.

RECOMMENDATION:
Diagnostic right breast mammogram and possible ultrasound in 6
months.

I have discussed the findings and recommendations with the patient.
If applicable, a reminder letter will be sent to the patient
regarding the next appointment.

BI-RADS CATEGORY  3: Probably benign.

## 2021-08-30 DIAGNOSIS — M5412 Radiculopathy, cervical region: Secondary | ICD-10-CM | POA: Diagnosis not present

## 2021-09-16 DIAGNOSIS — M542 Cervicalgia: Secondary | ICD-10-CM | POA: Diagnosis not present

## 2021-09-21 DIAGNOSIS — M5412 Radiculopathy, cervical region: Secondary | ICD-10-CM | POA: Diagnosis not present

## 2021-09-23 DIAGNOSIS — M5412 Radiculopathy, cervical region: Secondary | ICD-10-CM | POA: Diagnosis not present

## 2021-09-27 ENCOUNTER — Ambulatory Visit: Payer: BC Managed Care – PPO | Admitting: Nurse Practitioner

## 2021-09-27 ENCOUNTER — Other Ambulatory Visit: Payer: Self-pay | Admitting: Nurse Practitioner

## 2021-09-27 DIAGNOSIS — Z6828 Body mass index (BMI) 28.0-28.9, adult: Secondary | ICD-10-CM

## 2021-09-29 ENCOUNTER — Ambulatory Visit (HOSPITAL_COMMUNITY): Payer: Self-pay | Admitting: Orthopedic Surgery

## 2021-10-03 ENCOUNTER — Encounter: Payer: Self-pay | Admitting: Nurse Practitioner

## 2021-10-03 ENCOUNTER — Ambulatory Visit (INDEPENDENT_AMBULATORY_CARE_PROVIDER_SITE_OTHER): Payer: BC Managed Care – PPO | Admitting: Nurse Practitioner

## 2021-10-03 VITALS — BP 135/86 | HR 84 | Temp 97.9°F | Resp 20 | Ht 65.0 in | Wt 159.0 lb

## 2021-10-03 DIAGNOSIS — I10 Essential (primary) hypertension: Secondary | ICD-10-CM

## 2021-10-03 DIAGNOSIS — F3342 Major depressive disorder, recurrent, in full remission: Secondary | ICD-10-CM | POA: Diagnosis not present

## 2021-10-03 DIAGNOSIS — K219 Gastro-esophageal reflux disease without esophagitis: Secondary | ICD-10-CM | POA: Diagnosis not present

## 2021-10-03 DIAGNOSIS — Z6830 Body mass index (BMI) 30.0-30.9, adult: Secondary | ICD-10-CM

## 2021-10-03 DIAGNOSIS — E785 Hyperlipidemia, unspecified: Secondary | ICD-10-CM

## 2021-10-03 MED ORDER — SERTRALINE HCL 100 MG PO TABS: ORAL_TABLET | ORAL | 1 refills | Status: DC

## 2021-10-03 MED ORDER — LISINOPRIL 10 MG PO TABS: 10.0000 mg | ORAL_TABLET | Freq: Every day | ORAL | 1 refills | Status: DC

## 2021-10-03 MED ORDER — ALPRAZOLAM 0.5 MG PO TABS: 0.5000 mg | ORAL_TABLET | Freq: Three times a day (TID) | ORAL | 5 refills | Status: DC

## 2021-10-03 NOTE — Progress Notes (Signed)
Subjective:    Patient ID: Gabriela Mccormick, female    DOB: [birth year], 56 y.o.   MRN: [id number]   Chief Complaint: medical management of chronic issues     HPI:  Gabriela Mccormick is a 56 y.o. who identifies as a female who was assigned female at birth.   Social history: Lives with: husband and sons     Comes in today for follow up of the following chronic medical issues:  1. Primary hypertension No c/o chest pain, sob or headache. Does not check blood pressure at home. BP Readings from Last 3 Encounters:  03/25/21 126/76  09/09/20 121/76  03/11/20 130/74     2. Hyperlipidemia with target LDL less than 100 Does try to watch diet and does occasional exercise. Lab Results  Component Value Date   CHOL 233 (H) 03/25/2021   HDL 72 03/25/2021   LDLCALC 105 (H) 03/25/2021   TRIG 331 (H) 03/25/2021   CHOLHDL 3.2 03/25/2021     3. Gastroesophageal reflux disease without esophagitis Takes OTC meds as needed  4. Recurrent major depressive disorder, in full remission ([place]) Is on zoloft and says she is doing well.  5. BMI 30.0-30.9,adult Weight is down 9lbs Wt Readings from Last 3 Encounters:  10/03/21 159 lb (72.1 kg)  03/25/21 168 lb 3.2 oz (76.3 kg)  09/09/20 169 lb (76.7 kg)   BMI Readings from Last 3 Encounters:  10/03/21 26.46 kg/m  03/25/21 27.99 kg/m  09/09/20 28.12 kg/m     New complaints: Scheduled for neck surgery  Allergies  Allergen Reactions   Bee Venom Anaphylaxis and Swelling   Enoxaparin Hives   Outpatient Encounter Medications as of 10/03/2021  Medication Sig   ALPRAZolam (XANAX) 0.5 MG tablet Take 1 tablet (0.5 mg total) by mouth 3 (three) times daily.   EPINEPHRINE 0.3 mg/0.3 mL IJ SOAJ injection INJECT 0.3 MLS (0.3 MG TOTAL) INTO THE MUSCLE ONCE.   lisinopril (ZESTRIL) 10 MG tablet Take 1 tablet (10 mg total) by mouth daily.   Phentermine-Topiramate (QSYMIA) 7.5-46 MG CP24 Take 1 tablet by mouth daily.   sertraline (ZOLOFT) 100 MG  tablet 1  po qd   No facility-administered encounter medications on file as of 10/03/2021.    Past Surgical History:  Procedure Laterality Date   ABDOMINAL HYSTERECTOMY     HERNIA REPAIR      No family history on file.    Controlled substance contract: 09/14/20      Review of Systems  Constitutional:  Negative for diaphoresis.  Eyes:  Negative for pain.  Respiratory:  Negative for shortness of breath.   Cardiovascular:  Negative for chest pain, palpitations and leg swelling.  Gastrointestinal:  Negative for abdominal pain.  Endocrine: Negative for polydipsia.  Skin:  Negative for rash.  Neurological:  Negative for dizziness, weakness and headaches.  Hematological:  Does not bruise/bleed easily.  All other systems reviewed and are negative.      Objective:   Physical Exam Vitals and nursing note reviewed.  Constitutional:      General: She is not in acute distress.    Appearance: Normal appearance. She is well-developed.  HENT:     Head: Normocephalic.     Right Ear: Tympanic membrane normal.     Left Ear: Tympanic membrane normal.     Nose: Nose normal.     Mouth/Throat:     Mouth: Mucous membranes are moist.  Eyes:     Pupils: Pupils are equal, round, and reactive to light.  Neck:     Vascular: No carotid bruit or JVD.  Cardiovascular:     Rate and Rhythm: Normal rate and regular rhythm.     Heart sounds: Normal heart sounds.  Pulmonary:     Effort: Pulmonary effort is normal. No respiratory distress.     Breath sounds: Normal breath sounds. No wheezing or rales.  Chest:     Chest wall: No tenderness.  Abdominal:     General: Bowel sounds are normal. There is no distension or abdominal bruit.     Palpations: Abdomen is soft. There is no hepatomegaly, splenomegaly, mass or pulsatile mass.     Tenderness: There is no abdominal tenderness.  Musculoskeletal:        General: Normal range of motion.     Cervical back: Normal range of motion and neck supple.      Comments: neck pain with flexion and extension  Lymphadenopathy:     Cervical: No cervical adenopathy.  Skin:    General: Skin is warm and dry.  Neurological:     Mental Status: She is alert and oriented to person, place, and time.     Deep Tendon Reflexes: Reflexes are normal and symmetric.  Psychiatric:        Behavior: Behavior normal.        Thought Content: Thought content normal.        Judgment: Judgment normal.    BP 135/86   Pulse 84   Temp 97.9 F (36.6 C) (Temporal)   Resp 20   Ht '5\' 5"'  (1.651 m)   Wt 159 lb (72.1 kg)   SpO2 100%   BMI 26.46 kg/m         Assessment & Plan:   [name] comes in today with chief complaint of Medical Management of Chronic Issues   Diagnosis and orders addressed:  1. Primary hypertension Low sodium diet - lisinopril (ZESTRIL) 10 MG tablet; Take 1 tablet (10 mg total) by mouth daily.  Dispense: 90 tablet; Refill: 1 - CBC with Differential/Platelet - CMP14+EGFR  2. Hyperlipidemia with target LDL less than 100 Low fat diet - Lipid panel  3. Gastroesophageal reflux disease without esophagitis Avoid spicy foods Do not eat 2 hours prior to bedtime  4. Recurrent major depressive disorder, in full remission ([place]) Stress management - ALPRAZolam (XANAX) 0.5 MG tablet; Take 1 tablet (0.5 mg total) by mouth 3 (three) times daily.  Dispense: 90 tablet; Refill: 5 - sertraline (ZOLOFT) 100 MG tablet; 1  po qd  Dispense: 90 tablet; Refill: 1  5. BMI 30.0-30.9,adult Discussed diet and exercise for person with BMI >25 Will recheck weight in 3-6 months    Labs pending Health Maintenance reviewed Diet and exercise encouraged  Follow up plan: 6 months   [name], FNP

## 2021-10-03 NOTE — Patient Instructions (Signed)

## 2021-10-05 DIAGNOSIS — M542 Cervicalgia: Secondary | ICD-10-CM | POA: Diagnosis not present

## 2021-10-05 DIAGNOSIS — M5412 Radiculopathy, cervical region: Secondary | ICD-10-CM | POA: Diagnosis not present

## 2021-10-05 DIAGNOSIS — Z5181 Encounter for therapeutic drug level monitoring: Secondary | ICD-10-CM | POA: Diagnosis not present

## 2021-10-05 DIAGNOSIS — G894 Chronic pain syndrome: Secondary | ICD-10-CM | POA: Diagnosis not present

## 2021-10-12 ENCOUNTER — Other Ambulatory Visit: Payer: Self-pay | Admitting: Nurse Practitioner

## 2021-10-12 DIAGNOSIS — Z6828 Body mass index (BMI) 28.0-28.9, adult: Secondary | ICD-10-CM

## 2021-10-12 DIAGNOSIS — F3342 Major depressive disorder, recurrent, in full remission: Secondary | ICD-10-CM

## 2021-10-12 NOTE — Telephone Encounter (Signed)
  Prescription Request  10/12/2021  Is this a "Controlled Substance" medicine? no  Have you seen your PCP in the last 2 weeks? yes  If YES, route message to pool  -  If NO, patient needs to be scheduled for appointment.  What is the name of the medication or equipment? Phentermine-Topiramate (QSYMIA) 7.5-46 MG CP24  Have you contacted your pharmacy to request a refill? yes   Which pharmacy would you like this sent to? [id number] [address] (Ph: [phone]   *Patient said she needs called in before 12:00 lunch time on 9/28 because she will be going out of town.    Patient notified that their request is being sent to the clinical staff for review and that they should receive a response within 2 business days.

## 2021-10-13 MED ORDER — QSYMIA 7.5-46 MG PO CP24: 1.0000 | ORAL_CAPSULE | Freq: Every day | ORAL | 5 refills | Status: DC

## 2021-10-13 NOTE — Telephone Encounter (Signed)
Please advise on refill Last OV 10/03/21 RTC 6 mos Last RF 03/25/21 Next OV NOT sched In notes on 10/03/21 under BMI no notes re: medication

## 2021-11-11 NOTE — Pre-Procedure Instructions (Signed)
Surgical Instructions    Your procedure is scheduled on Monday, December 05, 2021 at 7:30 AM.  Report to [facility] Main Entrance "A" at 5:30 A.M., then check in with the Admitting office.  Call this number if you have problems the morning of surgery:  [phone]   If you have any questions prior to your surgery date call [phone]: Open Monday-Friday 8am-4pm  *If you experience any cold or flu symptoms such as cough, fever, chills, shortness of breath, etc. between now and your scheduled surgery, please notify us.*    Remember:  Do not eat or drink after midnight the night before your surgery    Take these medicines the morning of surgery with A SIP OF WATER:  ALPRAZolam (XANAX) sertraline (ZOLOFT)  IF NEEDED: EPINEPHRINE gabapentin (NEURONTIN) traMADol (ULTRAM)  As of today, STOP taking any Aspirin (unless otherwise instructed by your surgeon) Aleve, Naproxen, Ibuprofen, Motrin, Advil, Goody's, BC's, all herbal medications, fish oil, and all vitamins.                     Do NOT Smoke (Tobacco/Vaping) for 24 hours prior to your procedure.  If you use a CPAP at night, you may bring your mask/headgear for your overnight stay.   Contacts, glasses, piercing's, hearing aid's, dentures or partials may not be worn into surgery, please bring cases for these belongings.    For patients admitted to the hospital, discharge time will be determined by your treatment team.   Patients discharged the day of surgery will not be allowed to drive home, and someone needs to stay with them for 24 hours.  SURGICAL WAITING ROOM VISITATION Patients having surgery or a procedure may have two support people in the waiting area. Visitors may stay in the waiting area during the procedure and switch out with other visitors if needed. Children under the age of 61 must have an adult accompany them who is not the patient. If the patient needs to stay at the hospital during part of their recovery,  the visitor guidelines for inpatient rooms apply.  Please refer to the [facility] website for the visitor guidelines for Inpatients (after your surgery is over and you are in a regular room).    Special instructions:   [facility]- Preparing For Surgery  Before surgery, you can play an important role. Because skin is not sterile, your skin needs to be as free of germs as possible. You can reduce the number of germs on your skin by washing with CHG (chlorahexidine gluconate) Soap before surgery.  CHG is an antiseptic cleaner which kills germs and bonds with the skin to continue killing germs even after washing.    Oral Hygiene is also important to reduce your risk of infection.  Remember - BRUSH YOUR TEETH THE MORNING OF SURGERY WITH YOUR REGULAR TOOTHPASTE  Please do not use if you have an allergy to CHG or antibacterial soaps. If your skin becomes reddened/irritated stop using the CHG.  Do not shave (including legs and underarms) for at least 48 hours prior to first CHG shower. It is OK to shave your face.  Please follow these instructions carefully.   Shower the NIGHT BEFORE SURGERY and the MORNING OF SURGERY  If you chose to wash your hair, wash your hair first as usual with your normal shampoo.  After you shampoo, rinse your hair and body thoroughly to remove the shampoo.  Use CHG Soap as you would any other liquid soap. You can  apply CHG directly to the skin and wash gently with a scrungie or a clean washcloth.   Apply the CHG Soap to your body ONLY FROM THE NECK DOWN.  Do not use on open wounds or open sores. Avoid contact with your eyes, ears, mouth and genitals (private parts). Wash Face and genitals (private parts)  with your normal soap.   Wash thoroughly, paying special attention to the area where your surgery will be performed.  Thoroughly rinse your body with warm water from the neck down.  DO NOT shower/wash with your normal soap after using and rinsing off the CHG  Soap.  Pat yourself dry with a CLEAN TOWEL.  Wear CLEAN PAJAMAS to bed the night before surgery  Place CLEAN SHEETS on your bed the night before your surgery  DO NOT SLEEP WITH PETS.   Day of Surgery: Take a shower with CHG soap. Do not wear jewelry or makeup Do not wear lotions, powders, perfumes/colognes, or deodorant. Do not shave 48 hours prior to surgery. Do not bring valuables to the hospital.  [facility] is not responsible for any belongings or valuables. Do not wear nail polish, gel polish, artificial nails, or any other type of covering on natural nails (fingers and toes) If you have artificial nails or gel coating that need to be removed by a nail salon, please have this removed prior to surgery. Artificial nails or gel coating may interfere with anesthesia's ability to adequately monitor your vital signs. Wear Clean/Comfortable clothing the morning of surgery Do not apply any deodorants/lotions.   Remember to brush your teeth WITH YOUR REGULAR TOOTHPASTE.   Please read over the following fact sheets that you were given.  If you received a COVID test during your pre-op visit  it is requested that you wear a mask when out in public, stay away from anyone that may not be feeling well and notify your surgeon if you develop symptoms. If you have been in contact with anyone that has tested positive in the last 10 days please notify you surgeon.

## 2021-11-14 ENCOUNTER — Other Ambulatory Visit: Payer: Self-pay

## 2021-11-14 ENCOUNTER — Encounter (HOSPITAL_COMMUNITY)
Admission: RE | Admit: 2021-11-14 | Discharge: 2021-11-14 | Disposition: A | Discharge status: Home / Self Care | Payer: BC Managed Care – PPO | Source: Ambulatory Visit | Attending: Orthopedic Surgery | Admitting: Orthopedic Surgery

## 2021-11-14 ENCOUNTER — Encounter (HOSPITAL_COMMUNITY): Payer: Self-pay

## 2021-11-14 VITALS — BP 116/79 | HR 86 | Temp 97.9°F | Resp 18 | Ht 65.0 in | Wt 158.0 lb

## 2021-11-14 DIAGNOSIS — I10 Essential (primary) hypertension: Secondary | ICD-10-CM | POA: Diagnosis not present

## 2021-11-14 DIAGNOSIS — Z01818 Encounter for other preprocedural examination: Secondary | ICD-10-CM

## 2021-11-14 HISTORY — DX: Depression, unspecified: F32.A

## 2021-11-14 LAB — BASIC METABOLIC PANEL
Anion gap: 10 (ref 5–15)
BUN: 15 mg/dL (ref 6–20)
CO2: 22 mmol/L (ref 22–32)
Calcium: 9 mg/dL (ref 8.9–10.3)
Chloride: 105 mmol/L (ref 98–111)
Creatinine, Ser: 0.66 mg/dL (ref 0.44–1.00)
GFR, Estimated: >60 mL/min (ref >60)
Glucose, Bld: 92 mg/dL (ref 70–99)
Potassium: 3.7 mmol/L (ref 3.5–5.1)
Sodium: 137 mmol/L (ref 135–145)

## 2021-11-14 LAB — CBC
HCT: 43.1 % (ref 36.0–46.0)
Hemoglobin: 14.2 g/dL (ref 12.0–15.0)
MCH: 32.1 pg (ref 26.0–34.0)
MCHC: 32.9 g/dL (ref 30.0–36.0)
MCV: 97.5 fL (ref 80.0–100.0)
Platelets: 197 10*3/uL (ref 150–400)
RBC: 4.42 MIL/uL (ref 3.87–5.11)
RDW: 12.4 % (ref 11.5–15.5)
WBC: 8.8 10*3/uL (ref 4.0–10.5)
nRBC: 0 % (ref 0.0–0.2)

## 2021-11-14 LAB — SURGICAL PCR SCREEN
MRSA, PCR: NEGATIVE
Staphylococcus aureus: NEGATIVE

## 2021-11-14 LAB — ABO/RH: ABO/RH(D): B NEG

## 2021-11-14 NOTE — Progress Notes (Signed)
PCP - Chevis Pretty, [place] Cardiologist - denies  PPM/ICD - denies   Chest x-ray - 07/16/16 EKG - 11/14/21 Stress Test - denies ECHO - denies Cardiac Cath - denies  Sleep Study - denies   DM- denies  Last dose of GLP1 agonist-  n/a   ASA/Blood Thinner Instructions: n/a   ERAS Protcol - no, NPO   COVID TEST- n/a   Anesthesia review: no  Patient denies shortness of breath, fever, cough and chest pain at PAT appointment   All instructions explained to the patient, with a verbal understanding of the material. Patient agrees to go over the instructions while at home for a better understanding. The opportunity to ask questions was provided.

## 2021-11-29 DIAGNOSIS — M5412 Radiculopathy, cervical region: Secondary | ICD-10-CM | POA: Diagnosis not present

## 2021-12-05 ENCOUNTER — Ambulatory Visit (HOSPITAL_COMMUNITY)
Admission: RE | Disposition: A | Discharge status: Home / Self Care | Payer: Self-pay | Source: Home / Self Care | Attending: Orthopedic Surgery

## 2021-12-05 ENCOUNTER — Observation Stay (HOSPITAL_COMMUNITY)
Admission: RE | Admit: 2021-12-05 | Discharge: 2021-12-06 | Disposition: A | Discharge status: Home / Self Care | Payer: BC Managed Care – PPO | Attending: Orthopedic Surgery | Admitting: Orthopedic Surgery

## 2021-12-05 ENCOUNTER — Ambulatory Visit (HOSPITAL_COMMUNITY): Payer: BC Managed Care – PPO | Admitting: Vascular Surgery

## 2021-12-05 ENCOUNTER — Other Ambulatory Visit: Payer: Self-pay

## 2021-12-05 ENCOUNTER — Encounter (HOSPITAL_COMMUNITY): Payer: Self-pay | Admitting: Orthopedic Surgery

## 2021-12-05 ENCOUNTER — Ambulatory Visit (HOSPITAL_COMMUNITY): Payer: BC Managed Care – PPO | Admitting: Certified Registered Nurse Anesthetist

## 2021-12-05 ENCOUNTER — Ambulatory Visit (HOSPITAL_COMMUNITY): Payer: BC Managed Care – PPO

## 2021-12-05 DIAGNOSIS — M4322 Fusion of spine, cervical region: Secondary | ICD-10-CM | POA: Diagnosis not present

## 2021-12-05 DIAGNOSIS — M5011 Cervical disc disorder with radiculopathy,  high cervical region: Secondary | ICD-10-CM | POA: Diagnosis not present

## 2021-12-05 DIAGNOSIS — I10 Essential (primary) hypertension: Secondary | ICD-10-CM | POA: Insufficient documentation

## 2021-12-05 DIAGNOSIS — M5030 Other cervical disc degeneration, unspecified cervical region: Secondary | ICD-10-CM | POA: Diagnosis not present

## 2021-12-05 DIAGNOSIS — M4722 Other spondylosis with radiculopathy, cervical region: Secondary | ICD-10-CM | POA: Diagnosis not present

## 2021-12-05 DIAGNOSIS — M5020 Other cervical disc displacement, unspecified cervical region: Secondary | ICD-10-CM | POA: Diagnosis not present

## 2021-12-05 DIAGNOSIS — Z85038 Personal history of other malignant neoplasm of large intestine: Secondary | ICD-10-CM | POA: Diagnosis not present

## 2021-12-05 DIAGNOSIS — M4050 Lordosis, unspecified, site unspecified: Secondary | ICD-10-CM | POA: Diagnosis not present

## 2021-12-05 DIAGNOSIS — M4312 Spondylolisthesis, cervical region: Secondary | ICD-10-CM | POA: Diagnosis not present

## 2021-12-05 HISTORY — PX: ANTERIOR CERVICAL DECOMPRESSION/DISCECTOMY FUSION 4 LEVELS: SHX5556

## 2021-12-05 LAB — TYPE AND SCREEN
ABO/RH(D): B NEG
Antibody Screen: NEGATIVE

## 2021-12-05 SURGERY — ANTERIOR CERVICAL DECOMPRESSION/DISCECTOMY FUSION 4 LEVELS
Anesthesia: General

## 2021-12-05 MED ORDER — MIDAZOLAM HCL 2 MG/2ML IJ SOLN
INTRAMUSCULAR | Status: AC
  Filled 2021-12-05: qty 2

## 2021-12-05 MED ORDER — MIDAZOLAM HCL 5 MG/5ML IJ SOLN
INTRAMUSCULAR | Status: DC | PRN
  Administered 2021-12-05: 2 mg via INTRAVENOUS

## 2021-12-05 MED ORDER — AMISULPRIDE (ANTIEMETIC) 5 MG/2ML IV SOLN: 10.0000 mg | Freq: Once | INTRAVENOUS | Status: DC | PRN

## 2021-12-05 MED ORDER — PHENYLEPHRINE HCL-NACL 20-0.9 MG/250ML-% IV SOLN
INTRAVENOUS | Status: DC | PRN
  Administered 2021-12-05: 10 ug/min via INTRAVENOUS

## 2021-12-05 MED ORDER — KETOROLAC TROMETHAMINE 30 MG/ML IJ SOLN
30.0000 mg | Freq: Once | INTRAMUSCULAR | Status: AC
  Administered 2021-12-05: 30 mg via INTRAVENOUS

## 2021-12-05 MED ORDER — LACTATED RINGERS IV SOLN: INTRAVENOUS | Status: DC

## 2021-12-05 MED ORDER — CEFAZOLIN SODIUM-DEXTROSE 2-4 GM/100ML-% IV SOLN
2.0000 g | INTRAVENOUS | Status: AC
  Administered 2021-12-05 (×2): 2 g via INTRAVENOUS
  Filled 2021-12-05: qty 100

## 2021-12-05 MED ORDER — HYDROMORPHONE HCL 1 MG/ML IJ SOLN
0.2500 mg | INTRAMUSCULAR | Status: DC | PRN
  Administered 2021-12-05 (×2): 0.5 mg via INTRAVENOUS

## 2021-12-05 MED ORDER — THROMBIN 20000 UNITS EX SOLR
CUTANEOUS | Status: AC
  Filled 2021-12-05: qty 20000

## 2021-12-05 MED ORDER — FENTANYL CITRATE (PF) 250 MCG/5ML IJ SOLN
INTRAMUSCULAR | Status: AC
  Filled 2021-12-05: qty 5

## 2021-12-05 MED ORDER — OXYCODONE HCL 5 MG PO TABS
10.0000 mg | ORAL_TABLET | ORAL | Status: DC | PRN
  Administered 2021-12-05 – 2021-12-06 (×4): 10 mg via ORAL
  Filled 2021-12-05 (×4): qty 2

## 2021-12-05 MED ORDER — GABAPENTIN 300 MG PO CAPS: 300.0000 mg | ORAL_CAPSULE | Freq: Every evening | ORAL | Status: DC | PRN

## 2021-12-05 MED ORDER — HYDROMORPHONE HCL 1 MG/ML IJ SOLN
INTRAMUSCULAR | Status: AC
  Administered 2021-12-05: 0.5 mg via INTRAVENOUS
  Filled 2021-12-05: qty 1

## 2021-12-05 MED ORDER — OXYCODONE HCL 5 MG PO TABS
5.0000 mg | ORAL_TABLET | Freq: Once | ORAL | Status: AC
  Administered 2021-12-05: 5 mg via ORAL

## 2021-12-05 MED ORDER — ACETAMINOPHEN 325 MG PO TABS
650.0000 mg | ORAL_TABLET | ORAL | Status: DC | PRN
  Administered 2021-12-06: 650 mg via ORAL
  Filled 2021-12-05: qty 2

## 2021-12-05 MED ORDER — BUPIVACAINE-EPINEPHRINE (PF) 0.25% -1:200000 IJ SOLN
INTRAMUSCULAR | Status: AC
  Filled 2021-12-05: qty 30

## 2021-12-05 MED ORDER — METHOCARBAMOL 1000 MG/10ML IJ SOLN: 500.0000 mg | Freq: Four times a day (QID) | INTRAVENOUS | Status: DC | PRN

## 2021-12-05 MED ORDER — CEFAZOLIN SODIUM 1 G IJ SOLR
INTRAMUSCULAR | Status: AC
  Filled 2021-12-05: qty 20

## 2021-12-05 MED ORDER — SERTRALINE HCL 50 MG PO TABS
100.0000 mg | ORAL_TABLET | Freq: Every day | ORAL | Status: DC
  Administered 2021-12-05 – 2021-12-06 (×2): 100 mg via ORAL
  Filled 2021-12-05 (×2): qty 2

## 2021-12-05 MED ORDER — ACETAMINOPHEN 325 MG PO TABS: 325.0000 mg | ORAL_TABLET | Freq: Once | ORAL | Status: DC | PRN

## 2021-12-05 MED ORDER — ROCURONIUM BROMIDE 10 MG/ML (PF) SYRINGE
PREFILLED_SYRINGE | INTRAVENOUS | Status: AC
  Filled 2021-12-05: qty 10

## 2021-12-05 MED ORDER — SURGIFLO WITH THROMBIN (HEMOSTATIC MATRIX KIT) OPTIME
TOPICAL | Status: DC | PRN
  Administered 2021-12-05: 3 via TOPICAL

## 2021-12-05 MED ORDER — SODIUM CHLORIDE 0.9% FLUSH
3.0000 mL | Freq: Two times a day (BID) | INTRAVENOUS | Status: DC
  Administered 2021-12-05: 3 mL via INTRAVENOUS

## 2021-12-05 MED ORDER — ONDANSETRON HCL 4 MG/2ML IJ SOLN
INTRAMUSCULAR | Status: DC | PRN
  Administered 2021-12-05: 4 mg via INTRAVENOUS

## 2021-12-05 MED ORDER — PROPOFOL 10 MG/ML IV BOLUS
INTRAVENOUS | Status: AC
  Filled 2021-12-05: qty 20

## 2021-12-05 MED ORDER — LACTATED RINGERS IV SOLN: INTRAVENOUS | Status: DC | PRN

## 2021-12-05 MED ORDER — 0.9 % SODIUM CHLORIDE (POUR BTL) OPTIME
TOPICAL | Status: DC | PRN
  Administered 2021-12-05: 1000 mL

## 2021-12-05 MED ORDER — ONDANSETRON HCL 4 MG PO TABS: 4.0000 mg | ORAL_TABLET | Freq: Three times a day (TID) | ORAL | 0 refills | Status: DC | PRN

## 2021-12-05 MED ORDER — ONDANSETRON HCL 4 MG/2ML IJ SOLN
INTRAMUSCULAR | Status: AC
  Filled 2021-12-05: qty 2

## 2021-12-05 MED ORDER — LIDOCAINE 2% (20 MG/ML) 5 ML SYRINGE
INTRAMUSCULAR | Status: DC | PRN
  Administered 2021-12-05: 40 mg via INTRAVENOUS

## 2021-12-05 MED ORDER — ACETAMINOPHEN 650 MG RE SUPP: 650.0000 mg | RECTAL | Status: DC | PRN

## 2021-12-05 MED ORDER — ONDANSETRON HCL 4 MG PO TABS: 4.0000 mg | ORAL_TABLET | Freq: Four times a day (QID) | ORAL | Status: DC | PRN

## 2021-12-05 MED ORDER — KETOROLAC TROMETHAMINE 30 MG/ML IJ SOLN
INTRAMUSCULAR | Status: AC
  Filled 2021-12-05: qty 1

## 2021-12-05 MED ORDER — LIDOCAINE 2% (20 MG/ML) 5 ML SYRINGE
INTRAMUSCULAR | Status: AC
  Filled 2021-12-05: qty 5

## 2021-12-05 MED ORDER — FENTANYL CITRATE (PF) 250 MCG/5ML IJ SOLN
INTRAMUSCULAR | Status: DC | PRN
  Administered 2021-12-05: 75 ug via INTRAVENOUS
  Administered 2021-12-05 (×2): 25 ug via INTRAVENOUS
  Administered 2021-12-05: 50 ug via INTRAVENOUS
  Administered 2021-12-05 (×3): 25 ug via INTRAVENOUS

## 2021-12-05 MED ORDER — MAGNESIUM CITRATE PO SOLN: 1.0000 | Freq: Once | ORAL | Status: DC | PRN

## 2021-12-05 MED ORDER — EPINEPHRINE 0.3 MG/0.3ML IJ SOAJ
0.3000 mg | INTRAMUSCULAR | Status: DC | PRN
  Filled 2021-12-05: qty 0.6

## 2021-12-05 MED ORDER — OXYCODONE HCL 5 MG PO TABS: 5.0000 mg | ORAL_TABLET | ORAL | Status: DC | PRN

## 2021-12-05 MED ORDER — DEXAMETHASONE SODIUM PHOSPHATE 10 MG/ML IJ SOLN
INTRAMUSCULAR | Status: AC
  Filled 2021-12-05: qty 1

## 2021-12-05 MED ORDER — METHOCARBAMOL 500 MG PO TABS: 500.0000 mg | ORAL_TABLET | Freq: Three times a day (TID) | ORAL | 0 refills | Status: AC | PRN

## 2021-12-05 MED ORDER — METHOCARBAMOL 500 MG PO TABS
500.0000 mg | ORAL_TABLET | Freq: Four times a day (QID) | ORAL | Status: DC | PRN
  Administered 2021-12-05 – 2021-12-06 (×3): 500 mg via ORAL
  Filled 2021-12-05 (×3): qty 1

## 2021-12-05 MED ORDER — MEPERIDINE HCL 25 MG/ML IJ SOLN: 6.2500 mg | INTRAMUSCULAR | Status: DC | PRN

## 2021-12-05 MED ORDER — DEXAMETHASONE SODIUM PHOSPHATE 10 MG/ML IJ SOLN
INTRAMUSCULAR | Status: DC | PRN
  Administered 2021-12-05: 10 mg via INTRAVENOUS

## 2021-12-05 MED ORDER — ACETAMINOPHEN 10 MG/ML IV SOLN
1000.0000 mg | Freq: Once | INTRAVENOUS | Status: DC | PRN
  Administered 2021-12-05: 1000 mg via INTRAVENOUS

## 2021-12-05 MED ORDER — PHENOL 1.4 % MT LIQD: 1.0000 | OROMUCOSAL | Status: DC | PRN

## 2021-12-05 MED ORDER — POLYETHYLENE GLYCOL 3350 17 G PO PACK
17.0000 g | PACK | Freq: Every day | ORAL | Status: DC | PRN
  Administered 2021-12-05: 17 g via ORAL
  Filled 2021-12-05: qty 1

## 2021-12-05 MED ORDER — PROPOFOL 10 MG/ML IV BOLUS
INTRAVENOUS | Status: DC | PRN
  Administered 2021-12-05: 140 mg via INTRAVENOUS

## 2021-12-05 MED ORDER — SUGAMMADEX SODIUM 200 MG/2ML IV SOLN
INTRAVENOUS | Status: DC | PRN
  Administered 2021-12-05: 281.2 mg via INTRAVENOUS

## 2021-12-05 MED ORDER — KETAMINE HCL 50 MG/5ML IJ SOSY
PREFILLED_SYRINGE | INTRAMUSCULAR | Status: AC
  Filled 2021-12-05: qty 5

## 2021-12-05 MED ORDER — ROCURONIUM BROMIDE 10 MG/ML (PF) SYRINGE
PREFILLED_SYRINGE | INTRAVENOUS | Status: DC | PRN
  Administered 2021-12-05 (×2): 50 mg via INTRAVENOUS
  Administered 2021-12-05: 100 mg via INTRAVENOUS

## 2021-12-05 MED ORDER — TRANEXAMIC ACID-NACL 1000-0.7 MG/100ML-% IV SOLN
1000.0000 mg | INTRAVENOUS | Status: AC
  Administered 2021-12-05: 1000 mg via INTRAVENOUS
  Filled 2021-12-05: qty 100

## 2021-12-05 MED ORDER — BUPIVACAINE-EPINEPHRINE 0.25% -1:200000 IJ SOLN
INTRAMUSCULAR | Status: DC | PRN
  Administered 2021-12-05: 10 mL

## 2021-12-05 MED ORDER — ONDANSETRON HCL 4 MG/2ML IJ SOLN: 4.0000 mg | Freq: Four times a day (QID) | INTRAMUSCULAR | Status: DC | PRN

## 2021-12-05 MED ORDER — LISINOPRIL 10 MG PO TABS
10.0000 mg | ORAL_TABLET | Freq: Every day | ORAL | Status: DC
  Administered 2021-12-05 – 2021-12-06 (×2): 10 mg via ORAL
  Filled 2021-12-05 (×2): qty 1

## 2021-12-05 MED ORDER — MENTHOL 3 MG MT LOZG: 1.0000 | LOZENGE | OROMUCOSAL | Status: DC | PRN

## 2021-12-05 MED ORDER — SODIUM CHLORIDE 0.9 % IV SOLN: 250.0000 mL | INTRAVENOUS | Status: DC

## 2021-12-05 MED ORDER — SCOPOLAMINE 1 MG/3DAYS TD PT72
MEDICATED_PATCH | TRANSDERMAL | Status: DC | PRN
  Administered 2021-12-05: 1 via TRANSDERMAL

## 2021-12-05 MED ORDER — ORAL CARE MOUTH RINSE: 15.0000 mL | Freq: Once | OROMUCOSAL | Status: AC

## 2021-12-05 MED ORDER — KETAMINE HCL 10 MG/ML IJ SOLN
INTRAMUSCULAR | Status: DC | PRN
  Administered 2021-12-05: 30 mg via INTRAVENOUS

## 2021-12-05 MED ORDER — ALPRAZOLAM 0.5 MG PO TABS
0.5000 mg | ORAL_TABLET | Freq: Three times a day (TID) | ORAL | Status: DC
  Administered 2021-12-05 – 2021-12-06 (×3): 0.5 mg via ORAL
  Filled 2021-12-05 (×3): qty 1

## 2021-12-05 MED ORDER — SODIUM CHLORIDE 0.9% FLUSH: 3.0000 mL | INTRAVENOUS | Status: DC | PRN

## 2021-12-05 MED ORDER — ACETAMINOPHEN 160 MG/5ML PO SOLN: 325.0000 mg | Freq: Once | ORAL | Status: DC | PRN

## 2021-12-05 MED ORDER — CEFAZOLIN SODIUM-DEXTROSE 1-4 GM/50ML-% IV SOLN
1.0000 g | Freq: Three times a day (TID) | INTRAVENOUS | Status: AC
  Administered 2021-12-05 – 2021-12-06 (×2): 1 g via INTRAVENOUS
  Filled 2021-12-05 (×2): qty 50

## 2021-12-05 MED ORDER — OXYCODONE HCL 5 MG PO TABS
ORAL_TABLET | ORAL | Status: AC
  Filled 2021-12-05: qty 1

## 2021-12-05 MED ORDER — PHENYLEPHRINE HCL (PRESSORS) 10 MG/ML IV SOLN
INTRAVENOUS | Status: DC | PRN
  Administered 2021-12-05 (×2): 80 ug via INTRAVENOUS

## 2021-12-05 MED ORDER — CHLORHEXIDINE GLUCONATE 0.12 % MT SOLN
15.0000 mL | Freq: Once | OROMUCOSAL | Status: AC
  Administered 2021-12-05: 15 mL via OROMUCOSAL
  Filled 2021-12-05: qty 15

## 2021-12-05 MED ORDER — ACETAMINOPHEN 10 MG/ML IV SOLN
INTRAVENOUS | Status: AC
  Filled 2021-12-05: qty 100

## 2021-12-05 MED ORDER — HYDROMORPHONE HCL 1 MG/ML IJ SOLN
1.0000 mg | INTRAMUSCULAR | Status: DC | PRN
  Administered 2021-12-05 (×2): 1 mg via INTRAVENOUS
  Filled 2021-12-05 (×2): qty 1

## 2021-12-05 MED ORDER — PROMETHAZINE HCL 25 MG/ML IJ SOLN: 6.2500 mg | INTRAMUSCULAR | Status: DC | PRN

## 2021-12-05 MED ORDER — OXYCODONE-ACETAMINOPHEN 10-325 MG PO TABS: 1.0000 | ORAL_TABLET | Freq: Four times a day (QID) | ORAL | 0 refills | Status: AC | PRN

## 2021-12-05 MED ORDER — SCOPOLAMINE 1 MG/3DAYS TD PT72
MEDICATED_PATCH | TRANSDERMAL | Status: AC
  Filled 2021-12-05: qty 1

## 2021-12-05 SURGICAL SUPPLY — 62 items
BAG COUNTER SPONGE SURGICOUNT (BAG) ×1 IMPLANT
BAND RUBBER #18 3X1/16 STRL (MISCELLANEOUS) IMPLANT
BLADE CLIPPER SURG (BLADE) IMPLANT
CABLE BIPOLOR RESECTION CORD (MISCELLANEOUS) ×1 IMPLANT
CANISTER SUCT 3000ML PPV (MISCELLANEOUS) ×1 IMPLANT
CLSR STERI-STRIP ANTIMIC 1/2X4 (GAUZE/BANDAGES/DRESSINGS) ×1 IMPLANT
COLLAR CERV LO CONTOUR FIRM DE (SOFTGOODS) IMPLANT
COVER MAYO STAND STRL (DRAPES) ×2 IMPLANT
COVER PLATE (Plate) ×7 IMPLANT
COVER SURGICAL LIGHT HANDLE (MISCELLANEOUS) ×2 IMPLANT
DEVICE FUSION NANLCK 6 DEG 6 (Screw) IMPLANT
DRAIN CHANNEL 15F RND FF W/TCR (WOUND CARE) IMPLANT
DRAPE C-ARM 42X72 X-RAY (DRAPES) ×1 IMPLANT
DRAPE POUCH INSTRU U-SHP 10X18 (DRAPES) IMPLANT
DRAPE SURG 17X23 STRL (DRAPES) ×1 IMPLANT
DRAPE U-SHAPE 47X51 STRL (DRAPES) ×1 IMPLANT
DRSG OPSITE POSTOP 4X6 (GAUZE/BANDAGES/DRESSINGS) ×1 IMPLANT
DURAPREP 26ML APPLICATOR (WOUND CARE) ×1 IMPLANT
ELECT COATED BLADE 2.86 ST (ELECTRODE) ×1 IMPLANT
ELECT PENCIL ROCKER SW 15FT (MISCELLANEOUS) ×1 IMPLANT
ELECT REM PT RETURN 9FT ADLT (ELECTROSURGICAL) ×1
ELECTRODE REM PT RTRN 9FT ADLT (ELECTROSURGICAL) ×1 IMPLANT
FUSION TCS NANOLOCK 6 DEG 6 (Screw) ×9 IMPLANT
GLOVE BIO SURGEON STRL SZ 6.5 (GLOVE) ×1 IMPLANT
GLOVE BIOGEL PI IND STRL 6.5 (GLOVE) ×1 IMPLANT
GLOVE BIOGEL PI IND STRL 8.5 (GLOVE) ×1 IMPLANT
GLOVE SS BIOGEL STRL SZ 8.5 (GLOVE) ×1 IMPLANT
GOWN STRL REUS W/TWL 2XL LVL3 (GOWN DISPOSABLE) ×1 IMPLANT
KIT BASIN OR (CUSTOM PROCEDURE TRAY) ×1 IMPLANT
KIT TURNOVER KIT B (KITS) ×1 IMPLANT
NDL SPNL 18GX3.5 QUINCKE PK (NEEDLE) ×1 IMPLANT
NEEDLE SPNL 18GX3.5 QUINCKE PK (NEEDLE) ×1 IMPLANT
NS IRRIG 1000ML POUR BTL (IV SOLUTION) ×1 IMPLANT
PACK ORTHO CERVICAL (CUSTOM PROCEDURE TRAY) ×1 IMPLANT
PACK UNIVERSAL I (CUSTOM PROCEDURE TRAY) ×1 IMPLANT
PAD ARMBOARD 7.5X6 YLW CONV (MISCELLANEOUS) ×2 IMPLANT
PATTIES SURGICAL .5 X.5 (GAUZE/BANDAGES/DRESSINGS) IMPLANT
PIN DISTRACTION MAXCESS-C 14 (PIN) IMPLANT
PLATE LOCK ENDO TCS (Plate) ×4 IMPLANT
PLATE LOCK ENDO TCS F/COVER (Plate) IMPLANT
POSITIONER HEAD DONUT 9IN (MISCELLANEOUS) ×1 IMPLANT
PUTTY BONE DBX 5CC MIX (Putty) IMPLANT
SCREW ENDO BONE 3.8X14MM (Screw) IMPLANT
SCREW LOCKING 14MMX3.5MM (Screw) ×1 IMPLANT
SPONGE INTESTINAL PEANUT (DISPOSABLE) ×1 IMPLANT
SPONGE SURGIFOAM ABS GEL 100 (HEMOSTASIS) ×1 IMPLANT
SPONGE T-LAP 4X18 ~~LOC~~+RFID (SPONGE) ×2 IMPLANT
SURGIFLO W/THROMBIN 8M KIT (HEMOSTASIS) ×1 IMPLANT
SUT BONE WAX W31G (SUTURE) ×1 IMPLANT
SUT MNCRL AB 3-0 PS2 27 (SUTURE) ×1 IMPLANT
SUT VIC AB 2-0 CT1 18 (SUTURE) ×1 IMPLANT
SUT VIC AB 3-0 54X BRD REEL (SUTURE) ×1 IMPLANT
SUT VIC AB 3-0 BRD 54 (SUTURE) ×1
SYR BULB IRRIG 60ML STRL (SYRINGE) ×1 IMPLANT
SYR CONTROL 10ML LL (SYRINGE) ×1 IMPLANT
TAPE CLOTH 4X10 WHT NS (GAUZE/BANDAGES/DRESSINGS) ×1 IMPLANT
TAPE UMBILICAL COTTON 1/8X30 (MISCELLANEOUS) ×1 IMPLANT
TOWEL GREEN STERILE (TOWEL DISPOSABLE) ×1 IMPLANT
TOWEL GREEN STERILE FF (TOWEL DISPOSABLE) ×1 IMPLANT
TRAY FOLEY MTR SLVR 16FR STAT (SET/KITS/TRAYS/PACK) IMPLANT
WATER STERILE IRR 1000ML POUR (IV SOLUTION) ×1 IMPLANT
YANKAUER SUCT BULB TIP NO VENT (SUCTIONS) IMPLANT

## 2021-12-05 NOTE — Anesthesia Preprocedure Evaluation (Addendum)
Anesthesia Evaluation  Patient identified by MRN, date of birth, ID band Patient awake    Reviewed: Allergy & Precautions, NPO status , Patient's Chart, lab work & pertinent test results  Airway Mallampati: III  TM Distance: >3 FB Neck ROM: Limited    Dental  (+) Teeth Intact, Dental Advisory Given   Pulmonary    breath sounds clear to auscultation       Cardiovascular hypertension, Pt. on medications  Rhythm:Regular Rate:Normal     Neuro/Psych  PSYCHIATRIC DISORDERS Anxiety Depression       GI/Hepatic Neg liver ROS,GERD  ,,  Endo/Other  negative endocrine ROS    Renal/GU negative Renal ROS     Musculoskeletal negative musculoskeletal ROS (+)    Abdominal   Peds  Hematology negative hematology ROS (+)   Anesthesia Other Findings   Reproductive/Obstetrics                             Anesthesia Physical Anesthesia Plan  ASA: 2  Anesthesia Plan: General   Post-op Pain Management:    Induction: Intravenous  PONV Risk Score and Plan: 4 or greater and Ondansetron, Dexamethasone, Midazolam and Scopolamine patch - Pre-op  Airway Management Planned: Oral ETT and Video Laryngoscope Planned  Additional Equipment: None  Intra-op Plan:   Post-operative Plan: Extubation in OR  Informed Consent: I have reviewed the patients History and Physical, chart, labs and discussed the procedure including the risks, benefits and alternatives for the proposed anesthesia with the patient or authorized representative who has indicated his/her understanding and acceptance.     Dental advisory given  Plan Discussed with: CRNA  Anesthesia Plan Comments:        Anesthesia Quick Evaluation

## 2021-12-05 NOTE — Progress Notes (Signed)
Anesthesia notified pt is still 10/10 pain after '2mg'$  dilaudid. Once dose of toradol '30mg'$  IV and oxycode '5mg'$  PO ordered per Dr. Smith Robert

## 2021-12-05 NOTE — H&P (Signed)
History:  Gabriela Mccormick is a very pleasant 56 year old woman with progressive neck and radicular right arm pain. Despite appropriate conservative management her quality of life is continued to deteriorate. She has multilevel degenerative cervical spondylitic disease and we Gabriela Mccormick move forward with a 4 level ACDF: C3-7.  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   Current Outpatient Medications on File Prior to Encounter  Medication Sig Dispense Refill   EPINEPHRINE 0.3 mg/0.3 mL IJ SOAJ injection INJECT 0.3 MLS (0.3 MG TOTAL) INTO THE MUSCLE ONCE. (Patient taking differently: Inject 0.3 mg into the muscle as needed for anaphylaxis.) 2 Device 1   gabapentin (NEURONTIN) 300 MG capsule Take 300 mg by mouth at bedtime as needed (pain).     traMADol (ULTRAM) 50 MG tablet Take 50 mg by mouth every 6 (six) hours as needed for moderate pain.      Physical Exam: Vitals:   12/05/21 0546  BP: (!) 165/97  Pulse: 82  Resp: 18  Temp: 98.5 F (36.9 C)  SpO2: 97%   Body mass index is 25.79 kg/m. Clinical exam: Gabriela Mccormick is a pleasant individual, who appears younger than their stated age.  She is alert and orientated 3.  No shortness of breath, chest pain.  Abdomen is soft and non-tender, negative loss of bowel and bladder control, no rebound tenderness.  Negative: skin lesions abrasions contusions  Peripheral pulses: 2+ radial artery pulses bilaterally. LE compartments are: Soft and nontender.  Gait pattern: normal  Assistive devices: none  Neuro: 5/5 motor strength in the upper extremity. Negative Hoffman test, negative Spurling sign. 1+ deep tendon reflexes bilaterally in the upper extremity. Significant  neuropathic right upper extremity dysesthesias in the C5, C6, and C7 dermatomes. No significant left upper extremity dysesthesias.  Musculoskeletal: Significant neck pain with palpation and range of motion. No occipital headaches.  Imaging: X-rays of the cervical spine demonstrate multilevel degenerative cervical disc disease C4-C6. Slight retrolisthesis at C3-4-5. Positive loss of cervical lordosis.  Cervical MRI: completed on 09/21/2021. Degenerative grade 1 anterior listhesis C3-4 with severe right neuroforaminal narrowing. Degenerative cervical disc disease advanced at C4-5, C5-6, and C6-7 with severe bilateral neuroforaminal narrowing. Slight minimum retrolisthesis noted at each of those levels. No cord signal changes. Advanced facet arthropathy 4 through 7.  A/P: Summary: Gabriela Mccormick is a very pleasant 56 year old woman with severe neck and radicular right arm pain. She has had injection therapy and in the past physical therapy and she is attempting to do a physician directed home exercise program. Unfortunately her quality of life continues to deteriorate.  Imaging studies clearly show multilevel degenerative disc disease with severe foraminal stenosis producing nerve root compression. She has numbness and dysesthesias but fortunately no motor deficits or signs of spinal cord compression.  Patient's clinical exam demonstrates numbness and dysesthesias consistent with cervical radiculopathy. Fortunately there is no signs or symptoms of myelopathy or progressive motor deficits.  Since the patient's quality of life is continued to deteriorate and she has progressive neck and radicular right arm symptoms she is expressed a desire to move forward with surgery. Given the multilevel nature of her pathology I recommended a 4 level ACDF  C3-7. I have reviewed the risks and benefits as well as alternatives to surgery. All of her and her husband's questions were addressed.  Risks and benefits of surgery were  discussed with the patient. These include: Infection, bleeding, death, stroke, paralysis, ongoing or worse pain, need for additional surgery, nonunion, leak of spinal fluid, adjacent segment degeneration requiring additional fusion surgery. Pseudoarthrosis (nonunion)requiring supplemental posterior fixation. Throat pain, swallowing difficulties, hoarseness or change in voice.

## 2021-12-05 NOTE — Transfer of Care (Signed)
Immediate Anesthesia Transfer of Care Note  Patient: Gabriela Mccormick  Procedure(s) Performed: ANTERIOR CERVICAL DECOMPRESSION/DISCECTOMY FUSION 4 LEVELS C3-7  Patient Location: PACU  Anesthesia Type:General  Level of Consciousness: awake, alert , and oriented  Airway & Oxygen Therapy: Patient Spontanous Breathing and Patient connected to nasal cannula oxygen  Post-op Assessment: Report given to RN, Post -op Vital signs reviewed and stable, Patient moving all extremities X 4, and Patient able to stick tongue midline  Post vital signs: Reviewed  Last Vitals:  Vitals Value Taken Time  BP 131/77 12/05/21 1227  Temp 97.7   Pulse 113 12/05/21 1229  Resp 22 12/05/21 1229  SpO2 95 % 12/05/21 1229  Vitals shown include unvalidated device data.  Last Pain:  Vitals:   12/05/21 0552  TempSrc:   PainSc: 7          Complications: No notable events documented.

## 2021-12-05 NOTE — Discharge Instructions (Signed)

## 2021-12-05 NOTE — Anesthesia Postprocedure Evaluation (Signed)
Anesthesia Post Note  Patient: Gabriela Mccormick  Procedure(s) Performed: ANTERIOR CERVICAL DECOMPRESSION/DISCECTOMY FUSION 4 LEVELS C3-7     Patient location during evaluation: PACU Anesthesia Type: General Level of consciousness: awake and alert Pain management: pain level controlled Vital Signs Assessment: post-procedure vital signs reviewed and stable Respiratory status: spontaneous breathing, nonlabored ventilation, respiratory function stable and patient connected to nasal cannula oxygen Cardiovascular status: blood pressure returned to baseline and stable Postop Assessment: no apparent nausea or vomiting Anesthetic complications: no   No notable events documented.  Last Vitals:  Vitals:   12/05/21 1415 12/05/21 1438  BP: (!) 149/88 135/76  Pulse: (!) 107 100  Resp: 12 16  Temp:  36.9 C  SpO2: 97% 99%                 Effie Berkshire

## 2021-12-05 NOTE — Brief Op Note (Signed)
12/05/2021  12:33 PM  PATIENT:  Gabriela Mccormick  56 y.o. female  PRE-OPERATIVE DIAGNOSIS:  Cervical spondylotic radiculopathy C3-7  POST-OPERATIVE DIAGNOSIS:  Cervical spondylotic radiculopathy C3-7  PROCEDURE:  Procedure(s) with comments: ANTERIOR CERVICAL DECOMPRESSION/DISCECTOMY FUSION 4 LEVELS C3-7 (N/A) - 3.5 hrs 3 C-Bed  SURGEON:  Surgeon(s) and Role:    Melina Schools, MD - Primary  PHYSICIAN ASSISTANT:   ASSISTANTS: none   ANESTHESIA:   general  EBL:  100 mL   BLOOD ADMINISTERED:none  DRAINS: none   LOCAL MEDICATIONS USED:  MARCAINE     SPECIMEN:  No Specimen  DISPOSITION OF SPECIMEN:  N/A  COUNTS:  YES  TOURNIQUET:  * No tourniquets in log *  DICTATION: .Dragon Dictation  PLAN OF CARE: Admit for overnight observation  PATIENT DISPOSITION:  PACU - hemodynamically stable.

## 2021-12-05 NOTE — Anesthesia Procedure Notes (Signed)
Procedure Name: Intubation Date/Time: 12/05/2021 7:52 AM  Performed by: Maude Leriche, CRNAPre-anesthesia Checklist: Patient identified, Emergency Drugs available, Suction available and Patient being monitored Patient Re-evaluated:Patient Re-evaluated prior to induction Oxygen Delivery Method: Circle system utilized Preoxygenation: Pre-oxygenation with 100% oxygen Induction Type: IV induction Ventilation: Mask ventilation without difficulty Laryngoscope Size: Glidescope and 3 Tube type: Oral Tube size: 7.0 mm Number of attempts: 1 Airway Equipment and Method: Stylet, Video-laryngoscopy and Bite block Placement Confirmation: ETT inserted through vocal cords under direct vision, positive ETCO2 and breath sounds checked- equal and bilateral Secured at: 21 cm Tube secured with: Tape Dental Injury: Teeth and Oropharynx as per pre-operative assessment  Comments: Elective glidescope for c-spine stabilization

## 2021-12-05 NOTE — Op Note (Signed)
OPERATIVE REPORT  DATE OF SURGERY: 12/05/2021  PATIENT NAME:  Gabriela Mccormick MRN: [id number] DOB: 06/09/1965  PCP: Chevis Pretty, FNP  PRE-OPERATIVE DIAGNOSIS: Spondylitic radiculopathy C3-7.  Loss of normal cervical lordosis with retrolisthesis at C3-4 and multilevel degenerative cervical disc disease  POST-OPERATIVE DIAGNOSIS: Same  PROCEDURE:   ACDF C3-7  SURGEON:  Melina Schools, MD  PHYSICIAN ASSISTANT: None  ANESTHESIA:   General  EBL: 409 ml   Complications: None  Implants: Titan 0 profile intervertebral cage with integrated locking screws.  6 mm small lordotic implant used at each level.  14 mm locking screws with locking plate to prevent backout of screws.  Graft: DBX mix  BRIEF HISTORY: Gabriela Mccormick is a 56 y.o. female who presented to my office with complaints of severe neck and radicular right arm pain.  Attempts at conservative management had failed to alleviate her symptoms or improve her quality of life.  Patient is noted to have multilevel degenerative cervical disc disease, loss of normal cervical lordosis, and severe foraminal stenosis at multiple levels.  As result of the cervical pathology and the failure to improve with conservative management the decision was made to take her to the operating room to address her multilevel cervical spondylitic radiculopathy.  All appropriate risks, benefits, and alternatives were discussed with patient and consent was obtained.  PROCEDURE DETAILS: Patient was brought into the operating room and was properly positioned on the operating room table.  After induction with general anesthesia the patient was endotracheally intubated.  A timeout was taken to confirm all important data: including patient, procedure, and the level. Teds, SCD's were applied.   A Foley was placed by the nurse and the anterior cervical spine was prepped and draped in a standard fashion.  Using fluoroscopy I identified the levels of interest.   I then made a decision to use a transverse incision placed at the midportion of the C5.  Incision was marked and infiltrated with quarter percent Marcaine with epinephrine.  A transverse incision was made and sharp dissection was carried out down to and through the platysma.  A standard Smith-Robinson approach was taken to the cervical spine.  Once I had exposed the omohyoid muscle I released it from its sling but I did not feel as though I needed to sacrifice it for better visualization.  At this point I could easily visualize from the mid body of C3 down to the mid body of C7.  I could also easily imaged this without even needing to apply traction to the arms.  At this point time I am identified the C3-4 disc space and marked it with a needle.  A lateral intraoperative image was taken to confirm that I was at the appropriate level.  Once confirmed I marked the disc space level and then began mobilizing the longus coli muscle from the midportion of C3 down to the midportion of C7.  This was done bilaterally.  Once I had mobilized the longus coli muscle I then used a double-action Leksell rongeur to remove the anterior exostosis from the disc spaces.  With the exposure complete I then placed my retractor blades underneath the longus coli muscle deflated the endotracheal cuff and expanded the retractor system fluids appropriate width.  I then reinflated the endotracheal cuff.  At this point an annulotomy at the C3-4 level was performed and I remove the bulk of the disc material with pituitary rongeurs.  The inferior osteophyte from the body of C3 was resected with a 2  mm Kerrison rongeur.  I then placed distraction pins into the body of C3 and C4 and used the lamina spreader to distract the intervertebral space.  The distraction was maintained with pin set.  I then continued to use my curettes and pituitary rongeurs to resect all of the disc material.  I then used a fine nerve hook to dissect through the  posterior annulus and ultimately the posterior longitudinal ligament.  I was able to create a plane between the PLL and the thecal sac.  Using my 1 mm Kerrison rongeur I resected the PLL and remove the bone spurs especially under the uncovertebral joint.  At this point I confirmed that satisfactory decompression and discectomy at this level.  The endplates were then rasped and then I trialed the intervertebral spacers.  The 6 mm small lordotic implant provided the best overall fit and so it was packed with allograft and malleted into the position.  3.8 x 14 mm screw was then placed through the cage and into the C3 vertebral body, and a 3.5 x 14 mm screw was placed through the cage and into the C4 vertebral body.  Both screws had excellent purchase.  The locking plate was then secured to the cage to prevent backout of screws.  At this point time I repositioned the Caspar retracting blades to expose the C4-5 disc space.  Distraction pin was removed from C3 repositioned at C5.  The annulotomy was performed with a 15 blade scalpel and using the same technique I used at C3-4 I performed a complete discectomy at this level.  Again great care was taken to gently dissect through the posterior longitudinal ligament and remove it with the Kerrison rongeur.  I again undercut under the uncovertebral joints to address the foraminal stenosis.  The endplates were then rasped and I used the same 6 mm small lordotic implant.  It was again packed with allograft and malleted into position.  3.5 x 14 mm screws were used to secure the cage into the body of C4 and C5.  The locking plate was then inserted and tightened according manufacture standards.  The C5-6 disc space was then visualized and I moved the distraction pin from C4 to C6.  The same technique was used to perform a discectomy at this level.  Again the entire disc material was taken and the posterior longitudinal ligament was resected to adequately decompress the thecal  sac.  This allowed for further removal of the bone spur from the uncovertebral joint to better decompress the foramen.  The same size 6 mm small lordotic implant was then malleted into position and secured with 2.5 x 14 mm length screws.  The C6-7 disc space was exposed and a discectomy was performed in the same fashion.  Again the entire disc material was taken at and the overhanging osteophytes were resected with a Kerrison rongeur.  I dissected down to the posterior annulus and resected this with a Kerrison rongeur and dissected through the PLL and resected it with a #1 Kerrison rongeur.  After trialing I placed the same 6 mm small lordotic implant.  This was secured with 3.8 x 14 mm length screws.  All screws had excellent purchase.  All screws were locked into the position according manufacture standards.  Final imaging in both the AP and lateral planes demonstrated improved sagittal alignment and satisfactory position of all implants.  I irrigated the wound copiously with normal saline removed all of the remaining distraction pins and make sure  that hemostasis using bone wax and the exposed holes and Floseal and bipolar electrocautery.  After final irrigation I returned the trach and esophagus to midline and confirmed hemostasis.  I then closed the platysma with interrupted 2-0 Vicryl suture, and a 3-0 Monocryl for the skin.  Steri-Strips and dry dressings were applied and the patient was ultimately extubated transfer the PACU without incident.  The end of the case all needle sponge counts were correct.  There were no adverse intraoperative events.  Melina Schools, MD 12/05/2021 12:19 PM

## 2021-12-06 DIAGNOSIS — M5020 Other cervical disc displacement, unspecified cervical region: Secondary | ICD-10-CM | POA: Diagnosis not present

## 2021-12-06 DIAGNOSIS — M4722 Other spondylosis with radiculopathy, cervical region: Secondary | ICD-10-CM | POA: Diagnosis not present

## 2021-12-06 DIAGNOSIS — I10 Essential (primary) hypertension: Secondary | ICD-10-CM | POA: Diagnosis not present

## 2021-12-06 DIAGNOSIS — Z85038 Personal history of other malignant neoplasm of large intestine: Secondary | ICD-10-CM | POA: Diagnosis not present

## 2021-12-06 MED FILL — Thrombin For Soln 20000 Unit: CUTANEOUS | Qty: 1 | Status: AC

## 2021-12-06 NOTE — Progress Notes (Signed)
PT Cancellation Note and Discharge  Patient Details Name: Gabriela Mccormick MRN: [id number] DOB: January 17, 1965   Cancelled Treatment:    Reason Eval/Treat Not Completed: PT screened, no needs identified, will sign off. Discussed pt case with OT who reports pt is currently mobilizing at a modified independent level and does not require a formal PT evaluation at this time. PT signing off. If needs change, please reconsult.     Thelma Comp 12/06/2021, 10:00 AM  Rolinda Roan, PT, DPT Acute Rehabilitation Services Secure Chat Preferred Office: [phone]

## 2021-12-06 NOTE — Discharge Summary (Signed)
Patient ID: Gabriela Mccormick MRN: [id number] DOB/AGE: 56-25-1967 56 y.o.  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 female who was admitted 12/05/2021 for operative treatment of Cervical disc herniation. Patient failed conservative treatments (please see the history and physical for the specifics) and had severe unremitting pain that affects sleep, daily activities and work/hobbies. After pre-op clearance, the patient was taken to the operating room on 12/05/2021 and underwent  Procedure(s): ANTERIOR CERVICAL DECOMPRESSION/DISCECTOMY FUSION 4 LEVELS C3-7.    Patient was given perioperative antibiotics:  Anti-infectives (From admission, onward)    Start     Dose/Rate Route Frequency Ordered Stop   12/05/21 1930  ceFAZolin (ANCEF) IVPB 1 g/50 mL premix        1 g 100 mL/hr over 30 Minutes Intravenous Every 8 hours 12/05/21 1417 12/06/21 0339   12/05/21 0544  ceFAZolin (ANCEF) IVPB 2g/100 mL premix        2 g 200 mL/hr over 30 Minutes Intravenous 30 min pre-op 12/05/21 0544 12/05/21 1147        Patient was given sequential compression devices and early ambulation to prevent DVT.   Patient benefited maximally from hospital stay and there were no complications. At the time of discharge, the patient was urinating/moving their bowels without difficulty, tolerating a regular  diet, pain is controlled with oral pain medications and they have been cleared by PT/OT.   Recent vital signs: Patient Vitals for the past 24 hrs:  BP Temp Temp src Pulse Resp SpO2  12/06/21 0508 126/78 98.5 F (36.9 C) Oral 96 18 98 %  12/06/21 0015 131/86 98.2 F (36.8 C) Oral (!) 107 18 96 %  12/05/21 2007 124/70 98.3 F (36.8 C) Oral (!) 110 18 98 %  12/05/21 1816 128/80 98.5 F (36.9 C) Oral (!) 107 -- 94 %  12/05/21 1438 135/76 98.5 F (36.9 C) Oral 100 16 99 %  12/05/21 1415 (!) 149/88 -- -- (!) 107 12 97 %  12/05/21 1345 136/81 98.4 F (36.9 C) -- 100 14 93 %  12/05/21 1334 -- -- -- -- -- (!) 89 %  12/05/21 1330 (!) 126/90 -- -- (!) 102 14 94 %  12/05/21 1315 127/82 -- -- (!) 106 16 97 %  12/05/21 1300 125/87 -- -- (!) 104 11 98 %  12/05/21 1245 128/71 -- -- (!) 104 (!) 7 96 %  12/05/21 1230 128/84 98.2 F (36.8 C) -- (!) 113 (!) 22 95 %     Recent laboratory studies: No results for input(s): "WBC", "HGB", "HCT", "PLT", "NA", "K", "CL", "CO2", "BUN", "CREATININE", "GLUCOSE", "INR", "CALCIUM" in the last 72 hours.  Invalid input(s): "PT", "2"   Discharge Medications:   Allergies as of 12/06/2021       Reactions   Bee Venom Anaphylaxis, Swelling   Enoxaparin Hives        Medication List     STOP taking these medications  traMADol 50 MG tablet Commonly known as: ULTRAM       TAKE these medications    ALPRAZolam 0.5 MG tablet Commonly known as: XANAX Take 1 tablet (0.5 mg total) by mouth 3 (three) times daily.   EPINEPHrine 0.3 mg/0.3 mL Soaj injection Commonly known as: EPI-PEN INJECT 0.3 MLS (0.3 MG TOTAL) INTO THE MUSCLE ONCE. What changed: See the new instructions.   gabapentin 300 MG capsule Commonly known as: NEURONTIN Take 300 mg by mouth at bedtime as needed (pain).   lisinopril 10 MG tablet Commonly known as: ZESTRIL Take 1 tablet (10 mg total) by mouth daily.   methocarbamol 500 MG tablet Commonly known as: ROBAXIN Take 1 tablet  (500 mg total) by mouth every 8 (eight) hours as needed for up to 5 days for muscle spasms.   ondansetron 4 MG tablet Commonly known as: Zofran Take 1 tablet (4 mg total) by mouth every 8 (eight) hours as needed for nausea or vomiting.   oxyCODONE-acetaminophen 10-325 MG tablet Commonly known as: Percocet Take 1 tablet by mouth every 6 (six) hours as needed for up to 5 days for pain.   Qsymia 7.5-46 MG Cp24 Generic drug: Phentermine-Topiramate Take 1 tablet by mouth daily. What changed: when to take this   sertraline 100 MG tablet Commonly known as: ZOLOFT 1  po qd        Diagnostic Studies: DG Cervical Spine 2 or 3 views  Result Date: 12/05/2021 CLINICAL DATA:  Surgery C3-C7 ACDF EXAM: CERVICAL SPINE - 2-3 VIEW COMPARISON:  None available Fluoroscopy time: 1 minute 15 seconds Dose: 5.61 mGy Images: 3 FINDINGS: Images demonstrate placement of disc prostheses and fusion screws anteriorly at C3 through C7. No fracture or subluxation. Osseous mineralization normal. IMPRESSION: Anterior fusion C3-C7. No acute abnormalities. Electronically Signed   By: Lavonia Dana M.D.   On: 12/05/2021 13:50   DG C-Arm 1-60 Min-No Report  Result Date: 12/05/2021 Fluoroscopy was utilized by the requesting physician.  No radiographic interpretation.   DG C-Arm 1-60 Min-No Report  Result Date: 12/05/2021 Fluoroscopy was utilized by the requesting physician.  No radiographic interpretation.   DG C-Arm 1-60 Min-No Report  Result Date: 12/05/2021 Fluoroscopy was utilized by the requesting physician.  No radiographic interpretation.   DG C-Arm 1-60 Min-No Report  Result Date: 12/05/2021 Fluoroscopy was utilized by the requesting physician.  No radiographic interpretation.    Discharge Instructions     Incentive spirometry RT   Complete by: As directed         Follow-up Information     Melina Schools, MD Follow up in 2 week(s).   Specialty: Orthopedic Surgery Why: If symptoms worsen,  For suture removal, For wound re-check Contact information: [address] [place] [address] [phone]                 Discharge Plan:  discharge to home  Disposition: Patient is status post a 4 level ACDF and overall she is doing exceptionally well.  She does have some dysphagia and dysphonia but it is not severe.  She is neurologically intact with no focal motor deficits and the numbness/dysesthesias in the upper extremity has resolved.  There is no significant swelling at the incision site and she has no shortness of breath. She has been ambulating independently and has voided spontaneously.  Plan on discharge to home today.  Discharge instructions have been provided and she will be sent home with a prescription for Percocet, Robaxin, and if  needed Zofran.  Patient will follow-up with me in 2 weeks for wound check.  All of her questions were addressed.  Patient has been cleared from physical therapy standpoint.   Signed: Dahlia Bailiff for Dr. Melina Schools [facility] [phone] 12/06/2021, 7:43 AM

## 2021-12-06 NOTE — Plan of Care (Signed)
Pt doing well. Pt given D/C instructions with verbal understanding. Rx's were given to the Pt at D/C. Pt's incision is clean and dry with no sign of infection. Pt's IV was removed prior to D/C. Pt D/C'd home via wheelchair per MD order. Pt is stable @ D/C and has no other needs at this time. Holli Humbles, RN

## 2021-12-06 NOTE — Evaluation (Addendum)
Occupational Therapy Evaluation Patient Details Name: Gabriela Mccormick MRN: [id number] DOB: 02-20-1965 Today's Date: 12/06/2021   History of Present Illness Pt is a 56 y/o F s/p ACDF C3-7. PMH includes anxiety, depression, and HTN   Clinical Impression   Pt reports independence at baseline with ADLs and functional mobility, lives with spouse who can assist at d/c. Pt currently needing supervision for ADLs, Mod I for bed mobility and transfers without AD. Pt educated on collar wear, precautions, and compensatory strategies for ADLs. Pt able to verbalize and demo understanding. Pt presenting with impairments listed below. Anticipate no OT follow up needs  at d/c.     Recommendations for follow up therapy are one component of a multi-disciplinary discharge planning process, led by the attending physician.  Recommendations may be updated based on patient status, additional functional criteria and insurance authorization.   Follow Up Recommendations  No OT follow up     Assistance Recommended at Discharge PRN  Patient can return home with the following A little help with bathing/dressing/bathroom;Assist for transportation;Assistance with cooking/housework    Functional Status Assessment  Patient has had a recent decline in their functional status and demonstrates the ability to make significant improvements in function in a reasonable and predictable amount of time.  Equipment Recommendations  None recommended by OT    Recommendations for Other Services PT consult     Precautions / Restrictions Precautions Precautions: Cervical Precaution Booklet Issued: Yes (comment) Precaution Comments: educated pt on 3/3 cervical precautions Required Braces or Orthoses: Cervical Brace Cervical Brace: Hard collar;At all times Restrictions Weight Bearing Restrictions: No      Mobility Bed Mobility Overal bed mobility: Modified Independent             General bed mobility comments: log  roll technique    Transfers Overall transfer level: Modified independent Equipment used: None                      Balance Overall balance assessment: No apparent balance deficits (not formally assessed)                                         ADL either performed or assessed with clinical judgement   ADL Overall ADL's : Needs assistance/impaired Eating/Feeding: Supervision/ safety   Grooming: Supervision/safety   Upper Body Bathing: Supervision/ safety   Lower Body Bathing: Supervison/ safety   Upper Body Dressing : Supervision/safety   Lower Body Dressing: Supervision/safety   Toilet Transfer: Supervision/safety   Toileting- Clothing Manipulation and Hygiene: Supervision/safety   Tub/ Shower Transfer: Supervision/safety   Functional mobility during ADLs: Supervision/safety       Vision Baseline Vision/History: 0 No visual deficits Vision Assessment?: No apparent visual deficits     Perception Perception Perception Tested?: No   Praxis Praxis Praxis tested?: Not tested    Pertinent Vitals/Pain Pain Assessment Pain Assessment: Faces Pain Score: 4  Faces Pain Scale: Hurts little more Pain Location: incision and LUE Pain Descriptors / Indicators: Discomfort, Cramping Pain Intervention(s): Limited activity within patient's tolerance, Monitored during session, Repositioned     Hand Dominance     Extremity/Trunk Assessment Upper Extremity Assessment Upper Extremity Assessment: Overall WFL for tasks assessed (WFL without reaching overhead)   Lower Extremity Assessment Lower Extremity Assessment: Overall WFL for tasks assessed    Cervical / Trunk Assessment Cervical / Trunk Assessment: Normal   Communication  Communication Communication: No difficulties   Cognition Arousal/Alertness: Awake/alert Behavior During Therapy: WFL for tasks assessed/performed Overall Cognitive Status: Within Functional Limits for tasks assessed                                        General Comments  VSS on RA    Exercises     Shoulder Instructions      Home Living Family/patient expects to be discharged to:: Private residence Living Arrangements: Spouse/significant other Available Help at Discharge: Family;Available 24 hours/day Type of Home: House Home Access: Stairs to enter [employer] of Steps: 5 Entrance Stairs-Rails: Left Home Layout: Two level;Able to live on main level with bedroom/bathroom     Bathroom Shower/Tub: Tub/shower unit         Home Equipment: None          Prior Functioning/Environment Prior Level of Function : Independent/Modified Independent             Mobility Comments: no AD use ADLs Comments: ind        OT Problem List: Decreased strength;Decreased range of motion;Decreased activity tolerance;Impaired balance (sitting and/or standing)      OT Treatment/Interventions: Self-care/ADL training;Therapeutic exercise;Energy conservation;DME and/or AE instruction;Therapeutic activities;Patient/family education;Balance training    OT Goals(Current goals can be found in the care plan section) Acute Rehab OT Goals Patient Stated Goal: none stated OT Goal Formulation: With patient Time For Goal Achievement: 12/20/21 Potential to Achieve Goals: Good ADL Goals Pt Will Perform Upper Body Dressing: Independently;sitting Pt Will Perform Lower Body Dressing: Independently;sitting/lateral leans;sit to/from stand Pt Will Transfer to Toilet: Independently;ambulating;regular height toilet Pt Will Perform Tub/Shower Transfer: Tub transfer;Shower transfer;ambulating;Independently  OT Frequency: Min 2X/week    Co-evaluation              AM-PAC OT "6 Clicks" Daily Activity     Outcome Measure Help from another person eating meals?: None Help from another person taking care of personal grooming?: None Help from another person toileting, which includes using  toliet, bedpan, or urinal?: None Help from another person bathing (including washing, rinsing, drying)?: A Little Help from another person to put on and taking off regular upper body clothing?: None Help from another person to put on and taking off regular lower body clothing?: A Little 6 Click Score: 22   End of Session Equipment Utilized During Treatment: Cervical collar Nurse Communication: Mobility status  Activity Tolerance: Patient tolerated treatment well Patient left: in bed;with call bell/phone within reach  OT Visit Diagnosis: Unsteadiness on feet (R26.81);Other abnormalities of gait and mobility (R26.89);Muscle weakness (generalized) (M62.81)                Time: 5537-4827 OT Time Calculation (min): 25 min Charges:  OT General Charges $OT Visit: 1 Visit OT Evaluation $OT Eval Low Complexity: 1 Low OT Treatments $Self Care/Home Management : 8-22 mins  Renaye Rakers, OTD, OTR/L [employer] Preferred Acute Rehab [phone]   Renaye Rakers [place] 12/06/2021, 8:41 AM

## 2021-12-19 ENCOUNTER — Encounter (HOSPITAL_COMMUNITY): Payer: Self-pay | Admitting: Orthopedic Surgery

## 2021-12-20 DIAGNOSIS — M542 Cervicalgia: Secondary | ICD-10-CM | POA: Diagnosis not present

## 2021-12-26 ENCOUNTER — Encounter (HOSPITAL_COMMUNITY): Payer: Self-pay | Admitting: Orthopedic Surgery

## 2022-01-03 DIAGNOSIS — M542 Cervicalgia: Secondary | ICD-10-CM | POA: Diagnosis not present

## 2022-01-03 DIAGNOSIS — M961 Postlaminectomy syndrome, not elsewhere classified: Secondary | ICD-10-CM | POA: Diagnosis not present

## 2022-01-03 DIAGNOSIS — M5412 Radiculopathy, cervical region: Secondary | ICD-10-CM | POA: Diagnosis not present

## 2022-01-17 DIAGNOSIS — M542 Cervicalgia: Secondary | ICD-10-CM | POA: Diagnosis not present

## 2022-01-17 DIAGNOSIS — Z4889 Encounter for other specified surgical aftercare: Secondary | ICD-10-CM | POA: Diagnosis not present

## 2022-02-20 ENCOUNTER — Telehealth: Payer: Self-pay | Admitting: Nurse Practitioner

## 2022-02-20 NOTE — Telephone Encounter (Signed)
Patient states that she doesn't have any parts left that could be potentially cancerous. Would like to speak with you if you are willing to call her

## 2022-02-21 ENCOUNTER — Encounter: Payer: Self-pay | Admitting: Nurse Practitioner

## 2022-02-21 ENCOUNTER — Ambulatory Visit (INDEPENDENT_AMBULATORY_CARE_PROVIDER_SITE_OTHER): Payer: BC Managed Care – PPO | Admitting: Nurse Practitioner

## 2022-02-21 VITALS — BP 138/82 | HR 78 | Temp 97.5°F | Ht 65.0 in | Wt 161.8 lb

## 2022-02-21 DIAGNOSIS — N939 Abnormal uterine and vaginal bleeding, unspecified: Secondary | ICD-10-CM

## 2022-02-21 DIAGNOSIS — R5383 Other fatigue: Secondary | ICD-10-CM

## 2022-02-21 NOTE — Progress Notes (Signed)
   Subjective:    Patient ID: Gabriela Mccormick, female    DOB: [birth year], 57 y.o.   MRN: [id number]  Patient has history of MUCIN cancer 2020. She  had total hysterectomy and ovaries removed over 10 years ago and has been cancer free since then. She says that over the weekend she had episode of vaginal bleeding. It was a gush at one time. She says she is sure it was coming from her vagina. Bleeding stopped last night. She has tried contacting the doctor who did her surgery , but they have not called her back. She wold like to be lookd at to see if there is anything going on on vaginal area.    Review of Systems  Constitutional:  Negative for diaphoresis.  Eyes:  Negative for pain.  Respiratory:  Negative for shortness of breath.   Cardiovascular:  Negative for chest pain, palpitations and leg swelling.  Gastrointestinal:  Negative for abdominal pain.  Endocrine: Negative for polydipsia.  Skin:  Negative for rash.  Neurological:  Negative for dizziness, weakness and headaches.  Hematological:  Does not bruise/bleed easily.  All other systems reviewed and are negative.      Objective:   Physical Exam Constitutional:      Appearance: Normal appearance.  Cardiovascular:     Rate and Rhythm: Normal rate and regular rhythm.     Heart sounds: Normal heart sounds.  Pulmonary:     Breath sounds: Normal breath sounds.  Genitourinary:    General: Normal vulva.     Rectum: Normal.     Comments: Vaginal cuff intact No visible vaginal tears No bleeding Skin:    General: Skin is warm.  Neurological:     General: No focal deficit present.     Mental Status: She is alert and oriented to person, place, and time.  Psychiatric:        Mood and Affect: Mood normal.        Behavior: Behavior normal.    BP 138/82   Pulse 78   Temp (!) 97.5 F (36.4 C) (Skin)   Ht '5\' 5"'$  (1.651 m)   Wt 161 lb 12.8 oz (73.4 kg)   BMI 26.92 kg/m         Assessment & Plan:   [name] in  today with chief complaint of Gynecologic Exam   1. Other fatigue Labs pending  2. Vaginal bleeding Make appointment to follow up with oncology surgeon    The above assessment and management plan was discussed with the patient. The patient verbalized understanding of and has agreed to the management plan. Patient is aware to call the clinic if symptoms persist or worsen. Patient is aware when to return to the clinic for a follow-up visit. Patient educated on when it is appropriate to go to the emergency department.   [name], FNP

## 2022-02-22 LAB — CBC WITH DIFFERENTIAL/PLATELET
Basophils Absolute: 0 10*3/uL (ref 0.0–0.2)
Basos: 0 %
EOS (ABSOLUTE): 0.1 10*3/uL (ref 0.0–0.4)
Eos: 1 %
Hematocrit: 35.9 % (ref 34.0–46.6)
Hemoglobin: 12 g/dL (ref 11.1–15.9)
Immature Grans (Abs): 0 10*3/uL (ref 0.0–0.1)
Immature Granulocytes: 0 %
Lymphocytes Absolute: 2.8 10*3/uL (ref 0.7–3.1)
Lymphs: 35 %
MCH: 32.8 pg (ref 26.6–33.0)
MCHC: 33.4 g/dL (ref 31.5–35.7)
MCV: 98 fL — ABNORMAL HIGH (ref 79–97)
Monocytes Absolute: 0.6 10*3/uL (ref 0.1–0.9)
Monocytes: 7 %
Neutrophils Absolute: 4.6 10*3/uL (ref 1.4–7.0)
Neutrophils: 57 %
Platelets: 214 10*3/uL (ref 150–450)
RBC: 3.66 x10E6/uL — ABNORMAL LOW (ref 3.77–5.28)
RDW: 13.4 % (ref 11.7–15.4)
WBC: 8.2 10*3/uL (ref 3.4–10.8)

## 2022-03-03 ENCOUNTER — Telehealth: Payer: Self-pay | Admitting: Nurse Practitioner

## 2022-03-03 DIAGNOSIS — N939 Abnormal uterine and vaginal bleeding, unspecified: Secondary | ICD-10-CM | POA: Diagnosis not present

## 2022-03-03 DIAGNOSIS — C181 Malignant neoplasm of appendix: Secondary | ICD-10-CM | POA: Diagnosis not present

## 2022-03-03 NOTE — Telephone Encounter (Signed)
Pt wanted to let MMM know that she had her CT scan done and is still cancer free!!!!

## 2022-03-10 ENCOUNTER — Telehealth: Payer: Self-pay | Admitting: Nurse Practitioner

## 2022-03-10 NOTE — Telephone Encounter (Signed)
Will discuss at appointment.

## 2022-03-10 NOTE — Telephone Encounter (Signed)
This is controlled drug and cannot change or refill without being seen.

## 2022-03-10 NOTE — Telephone Encounter (Signed)
Pt scheduled for 03/14/2022

## 2022-03-10 NOTE — Telephone Encounter (Signed)
Please review and advise.

## 2022-03-10 NOTE — Telephone Encounter (Signed)
Left detailed message on patients voicemail with providers instructions

## 2022-03-14 ENCOUNTER — Encounter: Payer: Self-pay | Admitting: Nurse Practitioner

## 2022-03-14 ENCOUNTER — Ambulatory Visit (INDEPENDENT_AMBULATORY_CARE_PROVIDER_SITE_OTHER): Payer: BC Managed Care – PPO | Admitting: Nurse Practitioner

## 2022-03-14 VITALS — BP 118/79 | HR 84 | Temp 97.7°F | Resp 20 | Ht 65.0 in | Wt 162.0 lb

## 2022-03-14 DIAGNOSIS — K219 Gastro-esophageal reflux disease without esophagitis: Secondary | ICD-10-CM

## 2022-03-14 DIAGNOSIS — Z23 Encounter for immunization: Secondary | ICD-10-CM

## 2022-03-14 DIAGNOSIS — Z6830 Body mass index (BMI) 30.0-30.9, adult: Secondary | ICD-10-CM

## 2022-03-14 DIAGNOSIS — F3342 Major depressive disorder, recurrent, in full remission: Secondary | ICD-10-CM

## 2022-03-14 DIAGNOSIS — I10 Essential (primary) hypertension: Secondary | ICD-10-CM

## 2022-03-14 DIAGNOSIS — E785 Hyperlipidemia, unspecified: Secondary | ICD-10-CM

## 2022-03-14 MED ORDER — SERTRALINE HCL 100 MG PO TABS: ORAL_TABLET | ORAL | 1 refills | Status: DC

## 2022-03-14 MED ORDER — ALPRAZOLAM 0.5 MG PO TABS: 0.5000 mg | ORAL_TABLET | Freq: Three times a day (TID) | ORAL | 5 refills | Status: DC

## 2022-03-14 MED ORDER — QSYMIA 11.25-69 MG PO CP24: 1.0000 | ORAL_CAPSULE | Freq: Every day | ORAL | 5 refills | Status: DC

## 2022-03-14 MED ORDER — LISINOPRIL 10 MG PO TABS: 10.0000 mg | ORAL_TABLET | Freq: Every day | ORAL | 1 refills | Status: DC

## 2022-03-14 NOTE — Patient Instructions (Signed)

## 2022-03-14 NOTE — Progress Notes (Signed)
Subjective:    Patient ID: Gabriela Mccormick, female    DOB: 1965-04-09, 57 y.o.   MRN: [id number]   Chief Complaint: Medical Management of Chronic Issues    HPI:  Gabriela Mccormick is a 57 y.o. who identifies as a female who was assigned female at birth.   Social history: Lives with: husband Work history: currently not working   Comes in today for follow up of the following chronic medical issues:  1. Primary hypertension No c/o chest pain, sob or headache, does not check blood pressure at  home BP Readings from Last 3 Encounters:  03/14/22 118/79  02/21/22 138/82  12/06/21 124/76     2. Hyperlipidemia with target LDL less than 100 Has been watching diet and exercising. Lab Results  Component Value Date   CHOL 233 (H) 03/25/2021   HDL 72 03/25/2021   LDLCALC 105 (H) 03/25/2021   TRIG 331 (H) 03/25/2021   CHOLHDL 3.2 03/25/2021     3. Gastroesophageal reflux disease without esophagitis Only uses OTC meds as needed  4. Recurrent major depressive disorder, in full remission ([place]) Is on zoloft and is doing well.    03/14/2022    3:54 PM 02/21/2022    2:06 PM 10/03/2021   11:50 AM  Depression screen PHQ 2/9  Decreased Interest 0 0 1  Down, Depressed, Hopeless 0 0 1  PHQ - 2 Score 0 0 2  Altered sleeping 0 0 1  Tired, decreased energy 0 0 1  Change in appetite 0 0 1  Feeling bad or failure about yourself  0 0 0  Trouble concentrating 0 0 0  Moving slowly or fidgety/restless 0 0 0  Suicidal thoughts 0  0  PHQ-9 Score 0 0 5  Difficult doing work/chores Not difficult at all Not difficult at all Somewhat difficult     5. BMI 30.0-30.9,adult No recent weight changes Wt Readings from Last 3 Encounters:  03/14/22 162 lb (73.5 kg)  02/21/22 161 lb 12.8 oz (73.4 kg)  12/05/21 155 lb (70.3 kg)   BMI Readings from Last 3 Encounters:  03/14/22 26.96 kg/m  02/21/22 26.92 kg/m  12/05/21 25.79 kg/m        New complaints: None today  Allergies  Allergen  Reactions   Bee Venom Anaphylaxis and Swelling   Enoxaparin Hives   Outpatient Encounter Medications as of 03/14/2022  Medication Sig   ALPRAZolam (XANAX) 0.5 MG tablet Take 1 tablet (0.5 mg total) by mouth 3 (three) times daily.   EPINEPHRINE 0.3 mg/0.3 mL IJ SOAJ injection INJECT 0.3 MLS (0.3 MG TOTAL) INTO THE MUSCLE ONCE. (Patient taking differently: Inject 0.3 mg into the muscle as needed for anaphylaxis.)   lisinopril (ZESTRIL) 10 MG tablet Take 1 tablet (10 mg total) by mouth daily.   ondansetron (ZOFRAN) 4 MG tablet Take 1 tablet (4 mg total) by mouth every 8 (eight) hours as needed for nausea or vomiting.   Phentermine-Topiramate (QSYMIA) 7.5-46 MG CP24 Take 1 tablet by mouth daily. (Patient taking differently: Take 1 tablet by mouth once a week.)   sertraline (ZOLOFT) 100 MG tablet 1  po qd   [DISCONTINUED] gabapentin (NEURONTIN) 300 MG capsule Take 300 mg by mouth at bedtime as needed (pain).   No facility-administered encounter medications on file as of 03/14/2022.    Past Surgical History:  Procedure Laterality Date   ABDOMINAL HYSTERECTOMY     ABDOMINAL SURGERY  05/27/2018   pt states she remembers opening her eyes, seeing overhead OR  lights, staff standing over her & speaking to her   ANTERIOR CERVICAL DECOMPRESSION/DISCECTOMY FUSION 4 LEVELS N/A 12/05/2021   Procedure: ANTERIOR CERVICAL DECOMPRESSION/DISCECTOMY FUSION 4 LEVELS C3-7;  Surgeon: Melina Schools, MD;  Location: [facility];  Service: Orthopedics;  Laterality: N/A;  3.5 hrs 3 C-Bed   APPENDECTOMY     HERNIA REPAIR      History reviewed. No pertinent family history.    Controlled substance contract: 10/07/22     Review of Systems  Constitutional:  Negative for diaphoresis.  Eyes:  Negative for pain.  Respiratory:  Negative for shortness of breath.   Cardiovascular:  Negative for chest pain, palpitations and leg swelling.  Gastrointestinal:  Negative for abdominal pain.  Endocrine: Negative for polydipsia.   Skin:  Negative for rash.  Neurological:  Negative for dizziness, weakness and headaches.  Hematological:  Does not bruise/bleed easily.  All other systems reviewed and are negative.      Objective:   Physical Exam Vitals and nursing note reviewed.  Constitutional:      General: She is not in acute distress.    Appearance: Normal appearance. She is well-developed.  HENT:     Head: Normocephalic.     Right Ear: Tympanic membrane normal.     Left Ear: Tympanic membrane normal.     Nose: Nose normal.     Mouth/Throat:     Mouth: Mucous membranes are moist.  Eyes:     Pupils: Pupils are equal, round, and reactive to light.  Neck:     Vascular: No carotid bruit or JVD.  Cardiovascular:     Rate and Rhythm: Normal rate and regular rhythm.     Heart sounds: Normal heart sounds.  Pulmonary:     Effort: Pulmonary effort is normal. No respiratory distress.     Breath sounds: Normal breath sounds. No wheezing or rales.  Chest:     Chest wall: No tenderness.  Abdominal:     General: Bowel sounds are normal. There is no distension or abdominal bruit.     Palpations: Abdomen is soft. There is no hepatomegaly, splenomegaly, mass or pulsatile mass.     Tenderness: There is no abdominal tenderness.  Musculoskeletal:        General: Normal range of motion.     Cervical back: Normal range of motion and neck supple.  Lymphadenopathy:     Cervical: No cervical adenopathy.  Skin:    General: Skin is warm and dry.  Neurological:     Mental Status: She is alert and oriented to person, place, and time.     Deep Tendon Reflexes: Reflexes are normal and symmetric.  Psychiatric:        Behavior: Behavior normal.        Thought Content: Thought content normal.        Judgment: Judgment normal.    BP 118/79   Pulse 84   Temp 97.7 F (36.5 C) (Temporal)   Resp 20   Ht '5\' 5"'$  (1.651 m)   Wt 162 lb (73.5 kg)   SpO2 97%   BMI 26.96 kg/m         Assessment & Plan:   [name] comes in today with chief complaint of Medical Management of Chronic Issues   Diagnosis and orders addressed:  1. Primary hypertension Low sodium diet - lisinopril (ZESTRIL) 10 MG tablet; Take 1 tablet (10 mg total) by mouth daily.  Dispense: 90 tablet; Refill: 1 - CBC with Differential/Platelet - CMP14+EGFR  2. Hyperlipidemia with target LDL less than 100 Low fat diet - Lipid panel  3. Gastroesophageal reflux disease without esophagitis Avoid spicy foods Do not eat 2 hours prior to bedtime   4. Recurrent major depressive disorder, in full remission ([place]) Stress management - ALPRAZolam (XANAX) 0.5 MG tablet; Take 1 tablet (0.5 mg total) by mouth 3 (three) times daily.  Dispense: 90 tablet; Refill: 5 - sertraline (ZOLOFT) 100 MG tablet; 1  po qd  Dispense: 90 tablet; Refill: 1  5. BMI 30.0-30.9,adult Discussed diet and exercise for person with BMI >25 Will recheck weight in 3-6 months    Labs pending Health Maintenance reviewed Diet and exercise encouraged  Follow up plan: 6 months   [name], FNP

## 2022-03-14 NOTE — Addendum Note (Signed)
Addended by: Chevis Pretty on: 03/14/2022 04:20 PM   Modules accepted: Level of Service

## 2022-03-15 DIAGNOSIS — Z4889 Encounter for other specified surgical aftercare: Secondary | ICD-10-CM | POA: Diagnosis not present

## 2022-03-16 NOTE — Addendum Note (Signed)
Addended by: Rolena Infante on: 03/16/2022 10:06 AM   Modules accepted: Orders

## 2022-05-23 DIAGNOSIS — S0500XA Injury of conjunctiva and corneal abrasion without foreign body, unspecified eye, initial encounter: Secondary | ICD-10-CM | POA: Diagnosis not present

## 2022-09-11 ENCOUNTER — Ambulatory Visit: Payer: BC Managed Care – PPO | Admitting: Nurse Practitioner

## 2022-09-12 ENCOUNTER — Other Ambulatory Visit: Payer: Self-pay | Admitting: Nurse Practitioner

## 2022-09-12 DIAGNOSIS — F3342 Major depressive disorder, recurrent, in full remission: Secondary | ICD-10-CM

## 2022-09-13 ENCOUNTER — Other Ambulatory Visit: Payer: Self-pay | Admitting: Nurse Practitioner

## 2022-09-13 DIAGNOSIS — F3342 Major depressive disorder, recurrent, in full remission: Secondary | ICD-10-CM

## 2022-09-13 DIAGNOSIS — I10 Essential (primary) hypertension: Secondary | ICD-10-CM

## 2022-09-13 MED ORDER — SERTRALINE HCL 100 MG PO TABS: ORAL_TABLET | ORAL | 1 refills | Status: DC

## 2022-09-13 MED ORDER — ALPRAZOLAM 0.5 MG PO TABS: 0.5000 mg | ORAL_TABLET | Freq: Three times a day (TID) | ORAL | 5 refills | Status: DC

## 2022-09-13 MED ORDER — LISINOPRIL 10 MG PO TABS: 10.0000 mg | ORAL_TABLET | Freq: Every day | ORAL | 1 refills | Status: DC

## 2022-09-19 NOTE — Patient Instructions (Signed)

## 2022-09-26 ENCOUNTER — Encounter: Payer: Self-pay | Admitting: Nurse Practitioner

## 2022-09-26 ENCOUNTER — Ambulatory Visit (INDEPENDENT_AMBULATORY_CARE_PROVIDER_SITE_OTHER): Payer: BC Managed Care – PPO | Admitting: Nurse Practitioner

## 2022-09-26 VITALS — BP 135/85 | HR 93 | Temp 97.8°F | Resp 20 | Ht 65.0 in | Wt 170.0 lb

## 2022-09-26 DIAGNOSIS — F3342 Major depressive disorder, recurrent, in full remission: Secondary | ICD-10-CM

## 2022-09-26 DIAGNOSIS — K219 Gastro-esophageal reflux disease without esophagitis: Secondary | ICD-10-CM

## 2022-09-26 DIAGNOSIS — E785 Hyperlipidemia, unspecified: Secondary | ICD-10-CM | POA: Diagnosis not present

## 2022-09-26 DIAGNOSIS — I10 Essential (primary) hypertension: Secondary | ICD-10-CM | POA: Diagnosis not present

## 2022-09-26 DIAGNOSIS — Z6830 Body mass index (BMI) 30.0-30.9, adult: Secondary | ICD-10-CM

## 2022-09-26 MED ORDER — WEGOVY 0.25 MG/0.5ML ~~LOC~~ SOAJ: 0.2500 mg | SUBCUTANEOUS | 2 refills | Status: DC

## 2022-09-26 NOTE — Progress Notes (Signed)
Subjective:    Patient ID: Gabriela Mccormick, female    DOB: [birth year], 57 y.o.   MRN: [id number]   Chief Complaint: Medical Management of Chronic Issues    HPI:  Nychelle Mccormick is a 57 y.o. who identifies as a female who was assigned female at birth.   Social history: Lives with: husband and son Work history: insurance   Comes in today for follow up of the following chronic medical issues:  1. Primary hypertension No c/o chest pain, sob or headache. Does not check blood pressure at home. BP Readings from Last 3 Encounters:  09/26/22 135/85  03/14/22 118/79  02/21/22 138/82     2. Hyperlipidemia with target LDL less than 100 Does try to watch diet but does no dedicated exercise. Lab Results  Component Value Date   CHOL 233 (H) 03/25/2021   HDL 72 03/25/2021   LDLCALC 105 (H) 03/25/2021   TRIG 331 (H) 03/25/2021   CHOLHDL 3.2 03/25/2021   The 10-year ASCVD risk score (Arnett DK, et al., 2019) is: 3.3%   3. Gastroesophageal reflux disease without esophagitis Uses OTC meds as needed  4. Recurrent major depressive disorder, in Mccormick remission (H[facility]) Is on zoloft and is doing well.    09/26/2022   12:21 PM 03/14/2022    3:54 PM 02/21/2022    2:06 PM  Depression screen PHQ 2/9  Decreased Interest 0 0 0  Down, Depressed, Hopeless 1 0 0  PHQ - 2 Score 1 0 0  Altered sleeping 0 0 0  Tired, decreased energy 0 0 0  Change in appetite 1 0 0  Feeling bad or failure about yourself  0 0 0  Trouble concentrating 0 0 0  Moving slowly or fidgety/restless 0 0 0  Suicidal thoughts 0 0   PHQ-9 Score 2 0 0  Difficult doing work/chores Not difficult at all Not difficult at all Not difficult at all      09/26/2022   12:22 PM 03/14/2022    3:54 PM 02/21/2022    2:07 PM 10/03/2021   11:51 AM  GAD 7 : Generalized Anxiety Score  Nervous, Anxious, on Edge 0 0 0 1  Control/stop worrying 0 0 0 1  Worry too much - different things 0 0 0 0  Trouble relaxing 0 0 0 1  Restless 0 0 0  0  Easily annoyed or irritable 0 0 0 1  Afraid - awful might happen 0 0  1  Total GAD 7 Score 0 0  5  Anxiety Difficulty Not difficult at all Not difficult at all  Somewhat difficult      5. BMI 30.0-30.9,adult Weight is up 8lbs Wt Readings from Last 3 Encounters:  09/26/22 170 lb (77.1 kg)  03/14/22 162 lb (73.5 kg)  02/21/22 161 lb 12.8 oz (73.4 kg)   BMI Readings from Last 3 Encounters:  09/26/22 28.29 kg/m  03/14/22 26.96 kg/m  02/21/22 26.92 kg/m       New complaints: None today  Allergies  Allergen Reactions   Bee Venom Anaphylaxis and Swelling   Enoxaparin Hives   Outpatient Encounter Medications as of 09/26/2022  Medication Sig   ALPRAZolam (XANAX) 0.5 MG tablet Take 1 tablet (0.5 mg total) by mouth 3 (three) times daily.   EPINEPHRINE 0.3 mg/0.3 mL IJ SOAJ injection INJECT 0.3 MLS (0.3 MG TOTAL) INTO THE MUSCLE ONCE. (Patient taking differently: Inject 0.3 mg into the muscle as needed for anaphylaxis.)   lisinopril (ZESTRIL) 10 MG tablet  Take 1 tablet (10 mg total) by mouth daily.   Phentermine-Topiramate (QSYMIA) 11.25-69 MG CP24 Take 1 tablet by mouth daily.   sertraline (ZOLOFT) 100 MG tablet 1  po qd   [DISCONTINUED] ondansetron (ZOFRAN) 4 MG tablet Take 1 tablet (4 mg total) by mouth every 8 (eight) hours as needed for nausea or vomiting.   No facility-administered encounter medications on file as of 09/26/2022.    Past Surgical History:  Procedure Laterality Date   ABDOMINAL HYSTERECTOMY     ABDOMINAL SURGERY  05/27/2018   pt states she remembers opening her eyes, seeing overhead OR lights, staff standing over her & speaking to her   ANTERIOR CERVICAL DECOMPRESSION/DISCECTOMY FUSION 4 LEVELS N/A 12/05/2021   Procedure: ANTERIOR CERVICAL DECOMPRESSION/DISCECTOMY FUSION 4 LEVELS C3-7;  Surgeon: Venita Lick, MD;  Location: [facility];  Service: Orthopedics;  Laterality: N/A;  3.5 hrs 3 C-Bed   APPENDECTOMY     HERNIA REPAIR      History reviewed. No  pertinent family history.    Controlled substance contract: 09/26/22 drug screen today     Review of Systems  Constitutional:  Negative for diaphoresis.  Eyes:  Negative for pain.  Respiratory:  Negative for shortness of breath.   Cardiovascular:  Negative for chest pain, palpitations and leg swelling.  Gastrointestinal:  Negative for abdominal pain.  Endocrine: Negative for polydipsia.  Skin:  Negative for rash.  Neurological:  Negative for dizziness, weakness and headaches.  Hematological:  Does not bruise/bleed easily.  All other systems reviewed and are negative.      Objective:   Physical Exam Vitals and nursing note reviewed.  Constitutional:      General: She is not in acute distress.    Appearance: Normal appearance. She is well-developed.  HENT:     Head: Normocephalic.     Right Ear: Tympanic membrane normal.     Left Ear: Tympanic membrane normal.     Nose: Nose normal.     Mouth/Throat:     Mouth: Mucous membranes are moist.  Eyes:     Pupils: Pupils are equal, round, and reactive to light.  Neck:     Vascular: No carotid bruit or JVD.  Cardiovascular:     Rate and Rhythm: Normal rate and regular rhythm.     Heart sounds: Normal heart sounds.  Pulmonary:     Effort: Pulmonary effort is normal. No respiratory distress.     Breath sounds: Normal breath sounds. No wheezing or rales.  Chest:     Chest wall: No tenderness.  Abdominal:     General: Bowel sounds are normal. There is no distension or abdominal bruit.     Palpations: Abdomen is soft. There is no hepatomegaly, splenomegaly, mass or pulsatile mass.     Tenderness: There is no abdominal tenderness.  Musculoskeletal:        General: Normal range of motion.     Cervical back: Normal range of motion and neck supple.  Lymphadenopathy:     Cervical: No cervical adenopathy.  Skin:    General: Skin is warm and dry.  Neurological:     Mental Status: She is alert and oriented to person, place, and  time.     Deep Tendon Reflexes: Reflexes are normal and symmetric.  Psychiatric:        Behavior: Behavior normal.        Thought Content: Thought content normal.        Judgment: Judgment normal.    BP 135/85  Pulse 93   Temp 97.8 F (36.6 C) (Temporal)   Resp 20   Ht 5\' 5"  (1.651 m)   Wt 170 lb (77.1 kg)   SpO2 98%   BMI 28.29 kg/m         Assessment & Plan:   Lilieth Blume comes in today with chief complaint of Medical Management of Chronic Issues   Diagnosis and orders addressed:  1. Primary hypertension Low sodium diet - CBC with Differential/Platelet - CMP14+EGFR  2. Hyperlipidemia with target LDL less than 100 Low fat diet - Lipid panel  3. Gastroesophageal reflux disease without esophagitis Avoid spicy foods Do not eat 2 hours prior to bedtime   4. Recurrent major depressive disorder, in Mccormick remission ([facility]) Stress management - ToxASSURE Select 13 (MW), Urine  5. BMI 30.0-30.9,adult Discussed diet and exercise for person with BMI >25 Will recheck weight in 3-6 months  - Semaglutide-Weight Management (WEGOVY) 0.25 MG/0.5ML SOAJ; Inject 0.25 mg into the skin once a week.  Dispense: 2 mL; Refill: 2   Labs pending Health Maintenance reviewed Diet and exercise encouraged  Follow up plan: 6 months   Mary-Margaret Daphine Deutscher, FNP

## 2022-09-27 LAB — CMP14+EGFR
ALT: 34 IU/L — ABNORMAL HIGH (ref 0–32)
AST: 40 IU/L (ref 0–40)
Albumin: 4.8 g/dL (ref 3.8–4.9)
Alkaline Phosphatase: 94 IU/L (ref 44–121)
BUN/Creatinine Ratio: 19 (ref 9–23)
BUN: 14 mg/dL (ref 6–24)
Bilirubin Total: 0.5 mg/dL (ref 0.0–1.2)
CO2: 23 mmol/L (ref 20–29)
Calcium: 9.4 mg/dL (ref 8.7–10.2)
Chloride: 101 mmol/L (ref 96–106)
Creatinine, Ser: 0.72 mg/dL (ref 0.57–1.00)
Globulin, Total: 3.1 g/dL (ref 1.5–4.5)
Glucose: 81 mg/dL (ref 70–99)
Potassium: 4.4 mmol/L (ref 3.5–5.2)
Sodium: 139 mmol/L (ref 134–144)
Total Protein: 7.9 g/dL (ref 6.0–8.5)
eGFR: 97 mL/min/{1.73_m2} (ref >59)

## 2022-09-27 LAB — LIPID PANEL
Chol/HDL Ratio: 3 ratio (ref 0.0–4.4)
Cholesterol, Total: 228 mg/dL — ABNORMAL HIGH (ref 100–199)
HDL: 76 mg/dL (ref >39)
LDL Chol Calc (NIH): 117 mg/dL — ABNORMAL HIGH (ref 0–99)
Triglycerides: 207 mg/dL — ABNORMAL HIGH (ref 0–149)
VLDL Cholesterol Cal: 35 mg/dL (ref 5–40)

## 2022-09-27 LAB — CBC WITH DIFFERENTIAL/PLATELET
Basophils Absolute: 0.1 10*3/uL (ref 0.0–0.2)
Basos: 1 %
EOS (ABSOLUTE): 0.1 10*3/uL (ref 0.0–0.4)
Eos: 1 %
Hematocrit: 41 % (ref 34.0–46.6)
Hemoglobin: 13.4 g/dL (ref 11.1–15.9)
Immature Grans (Abs): 0 10*3/uL (ref 0.0–0.1)
Immature Granulocytes: 0 %
Lymphocytes Absolute: 2.1 10*3/uL (ref 0.7–3.1)
Lymphs: 25 %
MCH: 32.2 pg (ref 26.6–33.0)
MCHC: 32.7 g/dL (ref 31.5–35.7)
MCV: 99 fL — ABNORMAL HIGH (ref 79–97)
Monocytes Absolute: 0.6 10*3/uL (ref 0.1–0.9)
Monocytes: 7 %
Neutrophils Absolute: 5.7 10*3/uL (ref 1.4–7.0)
Neutrophils: 66 %
Platelets: 242 10*3/uL (ref 150–450)
RBC: 4.16 x10E6/uL (ref 3.77–5.28)
RDW: 11.6 % — ABNORMAL LOW (ref 11.7–15.4)
WBC: 8.5 10*3/uL (ref 3.4–10.8)

## 2022-09-28 ENCOUNTER — Other Ambulatory Visit: Payer: Self-pay | Admitting: Nurse Practitioner

## 2022-09-28 DIAGNOSIS — Z6830 Body mass index (BMI) 30.0-30.9, adult: Secondary | ICD-10-CM

## 2022-09-28 MED ORDER — WEGOVY 0.25 MG/0.5ML ~~LOC~~ SOAJ: 0.2500 mg | SUBCUTANEOUS | 2 refills | Status: DC

## 2022-10-03 ENCOUNTER — Other Ambulatory Visit (HOSPITAL_COMMUNITY): Payer: Self-pay

## 2022-10-03 ENCOUNTER — Telehealth: Payer: Self-pay

## 2022-10-03 LAB — TOXASSURE SELECT 13 (MW), URINE

## 2022-10-03 NOTE — Telephone Encounter (Signed)
Pharmacy Patient Advocate Encounter   Received notification from Physician's Office that prior authorization for [facility] is required/requested.   Insurance verification completed.   The patient is insured through [facility] .   Per test claim: PA required; PA submitted to [employer] via CoverMyMeds Key/confirmation #/EOC Key: [id number]  Status is pending

## 2022-10-09 ENCOUNTER — Other Ambulatory Visit (HOSPITAL_COMMUNITY): Payer: Self-pay

## 2022-10-09 NOTE — Telephone Encounter (Signed)
Pharmacy Patient Advocate Encounter  Received notification from [facility] that Prior Authorization for [facility]  has been APPROVED from 9.20.24 to 9.20.25. Ran test claim, Copay is $24.99. This test claim was processed through [facility]- copay amounts may vary at other pharmacies due to pharmacy/plan contracts, or as the patient moves through the different stages of their insurance plan.   PA #/Case ID/Reference #: Key: [id number]

## 2022-10-25 ENCOUNTER — Telehealth: Payer: Self-pay | Admitting: Nurse Practitioner

## 2022-10-26 ENCOUNTER — Other Ambulatory Visit: Payer: Self-pay | Admitting: Family Medicine

## 2022-10-26 MED ORDER — SEMAGLUTIDE-WEIGHT MANAGEMENT 0.5 MG/0.5ML ~~LOC~~ SOAJ: 0.5000 mg | SUBCUTANEOUS | 0 refills | Status: DC

## 2022-10-26 NOTE — Telephone Encounter (Signed)
Patient saw Gennette Pac on 09/26/22 and was given Wegovy 0.25.  She has been on this for a month and is ready to move up to the 0.5 but Gennette Pac did not send in that dosage yet.  Patient would like this to be sent to her pharmacy.

## 2022-10-26 NOTE — Telephone Encounter (Signed)
Pt asking to talk to nurse about this message. Please call back

## 2022-10-26 NOTE — Telephone Encounter (Signed)
Please let the patient know that I sent their prescription to their pharmacy. Thanks, WS 

## 2022-10-27 ENCOUNTER — Other Ambulatory Visit: Payer: Self-pay

## 2022-10-27 ENCOUNTER — Telehealth: Payer: Self-pay | Admitting: Nurse Practitioner

## 2022-10-27 MED ORDER — SEMAGLUTIDE-WEIGHT MANAGEMENT 0.5 MG/0.5ML ~~LOC~~ SOAJ: 0.5000 mg | SUBCUTANEOUS | 1 refills | Status: DC

## 2022-10-31 MED ORDER — SEMAGLUTIDE-WEIGHT MANAGEMENT 0.5 MG/0.5ML ~~LOC~~ SOAJ: 0.5000 mg | SUBCUTANEOUS | 0 refills | Status: DC

## 2022-10-31 NOTE — Telephone Encounter (Signed)
Patient aware and rx sent to different pharmacy as requested.

## 2022-10-31 NOTE — Telephone Encounter (Signed)
0.5mg  was sent in cannot skip doses because will make you sick

## 2022-11-30 ENCOUNTER — Other Ambulatory Visit: Payer: Self-pay | Admitting: Nurse Practitioner

## 2022-12-01 ENCOUNTER — Telehealth: Payer: Self-pay | Admitting: Family Medicine

## 2022-12-01 ENCOUNTER — Telehealth: Payer: Self-pay | Admitting: Nurse Practitioner

## 2022-12-01 MED ORDER — WEGOVY 1 MG/0.5ML ~~LOC~~ SOAJ: 1.0000 mg | SUBCUTANEOUS | 2 refills | Status: DC

## 2022-12-01 NOTE — Telephone Encounter (Signed)
Copied from CRM [id number]. Topic: Clinical - Prescription Issue >> Dec 01, 2022  1:51 PM Gabriela Mccormick wrote: Reason for CRM: Pt called about her Rx for Semaglutide-Weight Management (WEGOVY) 1 MG/0.5ML SOAJ. [id number] [place], [place] - [address] Can the doctor send the Rx right away to her other pharmacy at [facility] [phone] - [place], [place] - [address] AT [address] OF [address]?  CB# [phone]

## 2022-12-01 NOTE — Telephone Encounter (Signed)
Patient wegovy was suppose to be  increased to 1.0 not .5

## 2022-12-01 NOTE — Telephone Encounter (Signed)
Copied from CRM [id number]. Topic: Clinical - Medication Refill >> Nov 30, 2022  5:02 PM Tiffany H wrote: Most Recent Primary Care Visit:  Provider: Bennie Pierini  Department: [facility] MED  Visit Type: OFFICE VISIT  Date: 09/26/2022  Medication: BJYNWG .5mg    Has the patient contacted their pharmacy? Yes (Agent: If no, request that the patient contact the pharmacy for the refill. If patient does not wish to contact the pharmacy document the reason why and proceed with request.) (Agent: If yes, when and what did the pharmacy advise?)  Is this the correct pharmacy for this prescription? Yes If no, delete pharmacy and type the correct one.  This is the patient's preferred pharmacy:   [facility] [phone] - [place], [place] - [address] AT [facility] OF [address] [facility] [address] [place] [address] Phone: [phone] Fax: [phone]   Has the prescription been filled recently? Yes  Is the patient out of the medication? Yes  Has the patient been seen for an appointment in the last year OR does the patient have an upcoming appointment? Yes  Can we respond through MyChart? Yes  Agent: Please be advised that Rx refills may take up to 3 business days. We ask that you follow-up with your pharmacy.

## 2022-12-01 NOTE — Telephone Encounter (Signed)
Copied from CRM [id number]. Topic: Clinical - Prescription Issue >> Dec 01, 2022  9:47 AM Tiffany H wrote: Reason for CRM: Patient called to follow up about [facility] prescription. Patient advised that she has been on .25, then was on .5 and was supposed to increase up to 1.0mg . Attempted to contact clinic but was unable to get through. Was told by [facility] representative that Gennette Pac would be notified of the patient's concerns.

## 2022-12-01 NOTE — Telephone Encounter (Signed)
Please review and advise.

## 2022-12-01 NOTE — Telephone Encounter (Signed)
Duplicate encounter for same rx

## 2022-12-05 ENCOUNTER — Telehealth: Payer: Self-pay | Admitting: Family Medicine

## 2022-12-05 NOTE — Telephone Encounter (Signed)
Copied from CRM [id number]. Topic: Clinical - Prescription Issue >> Dec 05, 2022 12:26 PM Mosetta Putt H wrote: Reason for CRM: Semaglutide-Weight Management (WEGOVY) 1 MG/0.5ML [facility]

## 2022-12-05 NOTE — Telephone Encounter (Signed)
Spoke with patient, medication was [facility] to [facility] of [address] in [place].  I have cancelled the prescription and [facility] and gave them the prescription.  Patient aware.

## 2022-12-29 ENCOUNTER — Telehealth: Payer: Self-pay | Admitting: Family Medicine

## 2022-12-29 DIAGNOSIS — Z6828 Body mass index (BMI) 28.0-28.9, adult: Secondary | ICD-10-CM

## 2022-12-29 MED ORDER — WEGOVY 1.7 MG/0.75ML ~~LOC~~ SOAJ: 1.7000 mg | SUBCUTANEOUS | 2 refills | Status: DC

## 2022-12-29 NOTE — Telephone Encounter (Signed)
Copied from CRM [id number]. Topic: Clinical - Medication Refill >> Dec 29, 2022 11:55 AM Almira Coaster wrote: Most Recent Primary Care Visit:  Provider: Bennie Pierini  Department: [facility] MED  Visit Type: OFFICE VISIT  Date: 09/26/2022  Medication : Wegovy 1.7  Has the patient contacted their pharmacy? Yes (Agent: If no, request that the patient contact the pharmacy for the refill. If patient does not wish to contact the pharmacy document the reason why and proceed with request.) (Agent: If yes, when and what did the pharmacy advise?)  Is this the correct pharmacy for this prescription? Yes If no, delete pharmacy and type the correct one.  This is the patient's preferred pharmacy:   [facility] [phone] - [place], [place] - [address] AT [facility] OF [address] [facility] [address] [place] [address] Phone: [phone] Fax: [phone]   Has the prescription been filled recently? No  Is the patient out of the medication? Yes  Has the patient been seen for an appointment in the last year OR does the patient have an upcoming appointment? Yes  Can we respond through MyChart? Yes  Agent: Please be advised that Rx refills may take up to 3 business days. We ask that you follow-up with your pharmacy.

## 2022-12-29 NOTE — Addendum Note (Signed)
Addended by: Bennie Pierini on: 12/29/2022 04:48 PM   Modules accepted: Orders

## 2023-01-12 ENCOUNTER — Telehealth: Payer: Self-pay

## 2023-01-12 NOTE — Progress Notes (Signed)
 [facility] Assistant attempted to call patient on today regarding preventative mammogram screening. No answer from patient after multiple rings. Assistant left confidential voicemail for patient to return call.  Will call back patient back for final attempt.   Baruch Gouty [place]/VBCI  [facility] Assistant-Population Health [phone]

## 2023-01-19 ENCOUNTER — Telehealth: Payer: Self-pay | Admitting: Family Medicine

## 2023-01-19 NOTE — Telephone Encounter (Signed)
 Copied from CRM [id number]. Topic: Clinical - Prescription Issue >> Jan 18, 2023  5:43 PM Elle L wrote: Reason for CRM: The patient is requesting to raise the dosage for her Semaglutide -Weight Management (WEGOVY ) 1.7 MG/0.75ML SOAJ. [facility] [phone] - [place], [place] - [address] AT [facility] OF [address] [facility] [address], [place] [address] Phone: [phone]  Fax: [phone]

## 2023-01-22 ENCOUNTER — Ambulatory Visit: Payer: BC Managed Care – PPO

## 2023-01-30 ENCOUNTER — Ambulatory Visit: Payer: BC Managed Care – PPO | Admitting: Nurse Practitioner

## 2023-01-30 ENCOUNTER — Encounter: Payer: Self-pay | Admitting: Nurse Practitioner

## 2023-01-30 VITALS — BP 126/82 | HR 92 | Temp 98.6°F | Ht 65.0 in | Wt 163.0 lb

## 2023-01-30 DIAGNOSIS — Z7689 Persons encountering health services in other specified circumstances: Secondary | ICD-10-CM

## 2023-01-30 DIAGNOSIS — Z6827 Body mass index (BMI) 27.0-27.9, adult: Secondary | ICD-10-CM

## 2023-01-30 DIAGNOSIS — E663 Overweight: Secondary | ICD-10-CM | POA: Diagnosis not present

## 2023-01-30 MED ORDER — WEGOVY 2.4 MG/0.75ML ~~LOC~~ SOAJ: 2.4000 mg | SUBCUTANEOUS | 5 refills | Status: DC

## 2023-01-30 NOTE — Progress Notes (Signed)
 Subjective:    Patient ID: Gabriela Mccormick, female    DOB: [birth year], 58 y.o.   MRN: [id number]   Chief Complaint: recheck weight (Refill wegovy /)   HPI  Patient has been on wegovy  for several months. Says she needs to go up on her dose. Weight is down 13 lbs. Wt Readings from Last 3 Encounters:  01/30/23 163 lb (73.9 kg)  09/26/22 170 lb (77.1 kg)  03/14/22 162 lb (73.5 kg)   BMI Readings from Last 3 Encounters:  01/30/23 27.12 kg/m  09/26/22 28.29 kg/m  03/14/22 26.96 kg/m    Patient Active Problem List   Diagnosis Date Noted   Cervical disc herniation 12/05/2021   Hypertension 03/11/2020   Hyperlipidemia with target LDL less than 100 03/26/2015   BMI 30.0-30.9,adult 09/04/2014   Gastroesophageal reflux disease without esophagitis 09/04/2014   Depression 09/26/2013       Review of Systems  Constitutional:  Negative for diaphoresis.  Eyes:  Negative for pain.  Respiratory:  Negative for shortness of breath.   Cardiovascular:  Negative for chest pain, palpitations and leg swelling.  Gastrointestinal:  Negative for abdominal pain.  Endocrine: Negative for polydipsia.  Skin:  Negative for rash.  Neurological:  Negative for dizziness, weakness and headaches.  Hematological:  Does not bruise/bleed easily.  All other systems reviewed and are negative.      Objective:   Physical Exam Vitals and nursing note reviewed.  Constitutional:      General: She is not in acute distress.    Appearance: Normal appearance. She is well-developed.  Neck:     Vascular: No carotid bruit or JVD.  Cardiovascular:     Rate and Rhythm: Normal rate and regular rhythm.     Heart sounds: Normal heart sounds.  Pulmonary:     Effort: Pulmonary effort is normal. No respiratory distress.     Breath sounds: Normal breath sounds. No wheezing or rales.  Chest:     Chest wall: No tenderness.  Abdominal:     General: Bowel sounds are normal. There is no distension or abdominal bruit.      Palpations: Abdomen is soft. There is no hepatomegaly, splenomegaly, mass or pulsatile mass.     Tenderness: There is no abdominal tenderness.  Musculoskeletal:        General: Normal range of motion.     Cervical back: Normal range of motion and neck supple.  Lymphadenopathy:     Cervical: No cervical adenopathy.  Skin:    General: Skin is warm and dry.  Neurological:     Mental Status: She is alert and oriented to person, place, and time.     Deep Tendon Reflexes: Reflexes are normal and symmetric.  Psychiatric:        Behavior: Behavior normal.        Thought Content: Thought content normal.        Judgment: Judgment normal.    BP 126/82   Pulse 92   Temp 98.6 F (37 C) (Temporal)   Ht 5' 5 (1.651 m)   Wt 163 lb (73.9 kg)   SpO2 96%   BMI 27.12 kg/m         Assessment & Plan:  Gabriela Mccormick in today with chief complaint of recheck weight (Refill wegovy /)   1. Encounter for weight management (Primary) Low calorie diet  - Semaglutide -Weight Management (WEGOVY ) 2.4 MG/0.75ML SOAJ; Inject 2.4 mg into the skin once a week.  Dispense: 3 mL; Refill: 5  The above assessment and management plan was discussed with the patient. The patient verbalized understanding of and has agreed to the management plan. Patient is aware to call the clinic if symptoms persist or worsen. Patient is aware when to return to the clinic for a follow-up visit. Patient educated on when it is appropriate to go to the emergency department.   Mary-Margaret Gladis, FNP

## 2023-03-05 ENCOUNTER — Telehealth: Payer: Self-pay

## 2023-03-05 ENCOUNTER — Other Ambulatory Visit: Payer: Self-pay | Admitting: Nurse Practitioner

## 2023-03-05 ENCOUNTER — Other Ambulatory Visit (HOSPITAL_COMMUNITY): Payer: Self-pay

## 2023-03-05 DIAGNOSIS — Z7689 Persons encountering health services in other specified circumstances: Secondary | ICD-10-CM

## 2023-03-05 MED ORDER — WEGOVY 2.4 MG/0.75ML ~~LOC~~ SOAJ: 2.4000 mg | SUBCUTANEOUS | 5 refills | Status: DC

## 2023-03-05 NOTE — Telephone Encounter (Signed)
 Last Fill: 01/30/23  Last OV: 01/30/23 Next OV: 03/26/23  Routing to provider for review/authorization.

## 2023-03-05 NOTE — Telephone Encounter (Signed)
 Pharmacy Patient Advocate Encounter   Received notification from CoverMyMeds that prior authorization for [facility] 2.4MG /0.75ML auto-injectors is required/requested.   Insurance verification completed.   The patient is insured through [facility] .   Per test claim: PA required; PA submitted to above mentioned insurance via CoverMyMeds Key/confirmation #/EOC [id number] Status is pending

## 2023-03-05 NOTE — Telephone Encounter (Signed)
 Copied from CRM [id number]. Topic: Clinical - Medication Refill >> Mar 05, 2023 10:46 AM Eunice Blase wrote: Most Recent Primary Care Visit:  Provider: Bennie Pierini  Department: [facility]  Visit Type: ACUTE  Date: 01/30/2023  Medication: Semaglutide-Weight Management (WEGOVY) 2.4 MG/0.75ML SOAJ  Has the patient contacted their pharmacy? Yes (Agent: If no, request that the patient contact the pharmacy for the refill. If patient does not wish to contact the pharmacy document the reason why and proceed with request.) (Agent: If yes, when and what did the pharmacy advise?) Pharmacy state still have not received approval.   Is this the correct pharmacy for this prescription? Yes If no, delete pharmacy and type the correct one.  This is the patient's preferred pharmacy:  [facility] [phone] - [place], [place] - [address] AT [facility] OF [address] [facility] [address] [place] [address] Phone: [phone] Fax: [phone]     Has the prescription been filled recently? Yes  Is the patient out of the medication? Yes  Has the patient been seen for an appointment in the last year OR does the patient have an upcoming appointment? Yes  Can we respond through MyChart? Yes  Agent: Please be advised that Rx refills may take up to 3 business days. We ask that you follow-up with your pharmacy.

## 2023-03-08 ENCOUNTER — Other Ambulatory Visit (HOSPITAL_COMMUNITY): Payer: Self-pay

## 2023-03-09 ENCOUNTER — Telehealth: Payer: Self-pay

## 2023-03-09 NOTE — Telephone Encounter (Signed)
 Copied from CRM [id number]. Topic: Clinical - Prescription Issue >> Mar 09, 2023 12:21 PM Herbert Seta B wrote: Reason for CRM: Semaglutide-Weight Management (WEGOVY) 2.4 MG/0.75ML SOAJ PHARMACY NEEDS PRIOR AUTHORIZATION SENT FOR RX

## 2023-03-09 NOTE — Telephone Encounter (Signed)
 Placed a call to [employer] to check the status of the prior authorization. Per the representative, the current weight was missing.   Gave information and PA has been submitted .

## 2023-03-12 ENCOUNTER — Telehealth: Payer: Self-pay | Admitting: Nurse Practitioner

## 2023-03-12 NOTE — Telephone Encounter (Signed)
 Copied from CRM [id number]. Topic: Clinical - Medication Question >> Mar 12, 2023 10:11 AM Gabriela Mccormick wrote: Reason for CRM: The patient has been told by [facility] that their Semaglutide-Weight Management (WEGOVY) 2.4 MG/0.75ML [name] [[id number]] prescription will not be covered due to their inability to lose 5% of their starting weight   The patient has been directed to contact their PCP and inquire about potential alternatives   Please contact the patient further when possible

## 2023-03-13 ENCOUNTER — Telehealth: Payer: Self-pay | Admitting: Family Medicine

## 2023-03-13 MED ORDER — QSYMIA 3.75-23 MG PO CP24: 1.0000 | ORAL_CAPSULE | Freq: Every day | ORAL | 3 refills | Status: DC

## 2023-03-13 NOTE — Telephone Encounter (Signed)
 Copied from CRM [id number]. Topic: Clinical - Medication Question >> Mar 12, 2023  5:28 PM [name] wrote: Reason for CRM: Patient called in earlier about Semaglutide-Weight Management (WEGOVY) 2.4 MG/0.75ML Gabriela Mccormick [[id number]]. Patient states she was on a weight loss pill, QSYMIA  7.5-46 MG CP24 [[id number]], and was wondering if she could be put back onto it. And if so if it could be sent to the [facility].

## 2023-03-13 NOTE — Telephone Encounter (Signed)
 Qsymia presription sent to pharmacy  Meds ordered this encounter  Medications   Phentermine-Topiramate (QSYMIA) 3.75-23 MG CP24    Sig: Take 1 capsule by mouth daily.    Dispense:  30 capsule    Refill:  3    Supervising Provider:   Arville Care A [[id number]]   Mary-Margaret Daphine Deutscher, FNP

## 2023-03-13 NOTE — Addendum Note (Signed)
 Addended by: Bennie Pierini on: 03/13/2023 04:51 PM   Modules accepted: Orders

## 2023-03-14 NOTE — Telephone Encounter (Signed)
 Pharmacy Patient Advocate Encounter  Received notification from [facility] that Prior Authorization for [facility] 2.4MG /0.75ML auto-injectors has been DENIED.  Full denial letter will be uploaded to the media tab. See denial reason below.   PA #/Case ID/Reference #: [id number]

## 2023-03-15 ENCOUNTER — Other Ambulatory Visit: Payer: Self-pay | Admitting: Nurse Practitioner

## 2023-03-15 DIAGNOSIS — F3342 Major depressive disorder, recurrent, in full remission: Secondary | ICD-10-CM

## 2023-03-15 NOTE — Telephone Encounter (Signed)
 Pt is calling to ask what were the starting and ending weights that were sent. Patient is asking could the time of day and clothing matter with a determination. Please advise  CB- [phone]

## 2023-03-16 ENCOUNTER — Other Ambulatory Visit (HOSPITAL_COMMUNITY): Payer: Self-pay

## 2023-03-16 ENCOUNTER — Other Ambulatory Visit: Payer: Self-pay | Admitting: Nurse Practitioner

## 2023-03-16 ENCOUNTER — Telehealth: Payer: Self-pay

## 2023-03-16 ENCOUNTER — Encounter: Payer: Self-pay | Admitting: Nurse Practitioner

## 2023-03-16 DIAGNOSIS — F3342 Major depressive disorder, recurrent, in full remission: Secondary | ICD-10-CM

## 2023-03-16 MED ORDER — ALPRAZOLAM 0.5 MG PO TABS: 0.5000 mg | ORAL_TABLET | Freq: Three times a day (TID) | ORAL | 5 refills | Status: DC

## 2023-03-16 NOTE — Telephone Encounter (Signed)
 Copied from CRM [id number]. Topic: Clinical - Prescription Issue >> Mar 16, 2023 11:53 AM Gaetano Hawthorne wrote: Reason for CRM: Patient recently requested a refill on ALPRAZolam and was notified it was denied by the pharmacy - patient only has 2 pills left and she takes 3 a day - she has an upcomming appointment with Mary-Margaret on 03/10 - Would we be able to call a refill to tie her over until her appointment? Please call the patient.

## 2023-03-16 NOTE — Telephone Encounter (Signed)
 Copied from CRM [id number]. Topic: Clinical - Prescription Issue >> Mar 16, 2023 11:51 AM Gaetano Hawthorne wrote: Reason for CRM: Patient would like to know if her prescription for [facility] was submitted for prior authorization to her insurance company. Please call patient to confirm where it has been submitted, approved or if we are waiting for the approval.

## 2023-03-16 NOTE — Telephone Encounter (Signed)
 PA request has been Submitted. New Encounter created for follow up. For additional info see Pharmacy Prior Auth telephone encounter from 03/16/23.

## 2023-03-16 NOTE — Telephone Encounter (Signed)
 Pharmacy Patient Advocate Encounter  Received notification from [facility] that Prior Authorization for Qsymia 3.75-23MG  er capsules  has been APPROVED from 03/16/23 to 08/31/23. Ran test claim, Copay is $195.85. This test claim was processed through [facility]- copay amounts may vary at other pharmacies due to pharmacy/plan contracts, or as the patient moves through the different stages of their insurance plan.   PA #/Case ID/Reference #: [id number]

## 2023-03-16 NOTE — Telephone Encounter (Signed)
 Pharmacy Patient Advocate Encounter   Received notification from Pt Calls Messages that prior authorization for Qsymia 3.75-23MG  er capsules is required/requested.   Insurance verification completed.   The patient is insured through [facility] .   Per test claim: PA required; PA submitted to above mentioned insurance via CoverMyMeds Key/confirmation #/EOC [facility] Status is pending

## 2023-03-16 NOTE — Telephone Encounter (Signed)
 Patient weight at office visit prior to taking the medication and weight at last office visit was submitted with PA. Patient did not lose 5% of starting weight.

## 2023-03-26 ENCOUNTER — Ambulatory Visit: Payer: BC Managed Care – PPO | Admitting: Nurse Practitioner

## 2023-03-27 ENCOUNTER — Ambulatory Visit: Admitting: Nurse Practitioner

## 2023-04-05 DIAGNOSIS — F10239 Alcohol dependence with withdrawal, unspecified: Secondary | ICD-10-CM | POA: Diagnosis not present

## 2023-04-05 DIAGNOSIS — T1491XA Suicide attempt, initial encounter: Secondary | ICD-10-CM | POA: Diagnosis not present

## 2023-04-05 DIAGNOSIS — S0003XA Contusion of scalp, initial encounter: Secondary | ICD-10-CM | POA: Diagnosis not present

## 2023-04-05 DIAGNOSIS — E860 Dehydration: Secondary | ICD-10-CM | POA: Diagnosis not present

## 2023-04-05 DIAGNOSIS — M79622 Pain in left upper arm: Secondary | ICD-10-CM | POA: Diagnosis not present

## 2023-04-05 DIAGNOSIS — E872 Acidosis, unspecified: Secondary | ICD-10-CM | POA: Diagnosis not present

## 2023-04-05 DIAGNOSIS — F10139 Alcohol abuse with withdrawal, unspecified: Secondary | ICD-10-CM | POA: Diagnosis not present

## 2023-04-05 DIAGNOSIS — Z79899 Other long term (current) drug therapy: Secondary | ICD-10-CM | POA: Diagnosis not present

## 2023-04-05 DIAGNOSIS — F10129 Alcohol abuse with intoxication, unspecified: Secondary | ICD-10-CM | POA: Diagnosis not present

## 2023-04-05 DIAGNOSIS — F32A Depression, unspecified: Secondary | ICD-10-CM | POA: Diagnosis not present

## 2023-04-05 DIAGNOSIS — J9811 Atelectasis: Secondary | ICD-10-CM | POA: Diagnosis not present

## 2023-04-05 DIAGNOSIS — I10 Essential (primary) hypertension: Secondary | ICD-10-CM | POA: Diagnosis not present

## 2023-04-05 DIAGNOSIS — F10930 Alcohol use, unspecified with withdrawal, uncomplicated: Secondary | ICD-10-CM | POA: Diagnosis not present

## 2023-04-05 DIAGNOSIS — M79632 Pain in left forearm: Secondary | ICD-10-CM | POA: Diagnosis not present

## 2023-04-05 DIAGNOSIS — S0101XA Laceration without foreign body of scalp, initial encounter: Secondary | ICD-10-CM | POA: Diagnosis not present

## 2023-04-05 DIAGNOSIS — M79602 Pain in left arm: Secondary | ICD-10-CM | POA: Diagnosis not present

## 2023-04-05 DIAGNOSIS — Y908 Blood alcohol level of 240 mg/100 ml or more: Secondary | ICD-10-CM | POA: Diagnosis not present

## 2023-04-05 DIAGNOSIS — N179 Acute kidney failure, unspecified: Secondary | ICD-10-CM | POA: Diagnosis not present

## 2023-04-05 DIAGNOSIS — F1020 Alcohol dependence, uncomplicated: Secondary | ICD-10-CM | POA: Diagnosis not present

## 2023-04-05 DIAGNOSIS — N289 Disorder of kidney and ureter, unspecified: Secondary | ICD-10-CM | POA: Diagnosis not present

## 2023-04-05 DIAGNOSIS — R45851 Suicidal ideations: Secondary | ICD-10-CM | POA: Diagnosis not present

## 2023-04-05 DIAGNOSIS — Z043 Encounter for examination and observation following other accident: Secondary | ICD-10-CM | POA: Diagnosis not present

## 2023-04-05 DIAGNOSIS — M6282 Rhabdomyolysis: Secondary | ICD-10-CM | POA: Diagnosis not present

## 2023-04-10 DIAGNOSIS — F10930 Alcohol use, unspecified with withdrawal, uncomplicated: Secondary | ICD-10-CM | POA: Diagnosis not present

## 2023-04-10 DIAGNOSIS — Z79899 Other long term (current) drug therapy: Secondary | ICD-10-CM | POA: Diagnosis not present

## 2023-04-10 DIAGNOSIS — I10 Essential (primary) hypertension: Secondary | ICD-10-CM | POA: Diagnosis not present

## 2023-04-10 DIAGNOSIS — M25512 Pain in left shoulder: Secondary | ICD-10-CM | POA: Diagnosis not present

## 2023-04-10 DIAGNOSIS — Z89422 Acquired absence of other left toe(s): Secondary | ICD-10-CM | POA: Diagnosis not present

## 2023-04-10 DIAGNOSIS — Z560 Unemployment, unspecified: Secondary | ICD-10-CM | POA: Diagnosis not present

## 2023-04-10 DIAGNOSIS — F1020 Alcohol dependence, uncomplicated: Secondary | ICD-10-CM | POA: Diagnosis not present

## 2023-04-10 DIAGNOSIS — M25511 Pain in right shoulder: Secondary | ICD-10-CM | POA: Diagnosis not present

## 2023-04-10 DIAGNOSIS — Z653 Problems related to other legal circumstances: Secondary | ICD-10-CM | POA: Diagnosis not present

## 2023-04-10 DIAGNOSIS — R45851 Suicidal ideations: Secondary | ICD-10-CM | POA: Diagnosis not present

## 2023-04-10 DIAGNOSIS — F332 Major depressive disorder, recurrent severe without psychotic features: Secondary | ICD-10-CM | POA: Diagnosis not present

## 2023-04-10 DIAGNOSIS — G8929 Other chronic pain: Secondary | ICD-10-CM | POA: Diagnosis not present

## 2023-04-10 DIAGNOSIS — N179 Acute kidney failure, unspecified: Secondary | ICD-10-CM | POA: Diagnosis not present

## 2023-04-10 DIAGNOSIS — F32A Depression, unspecified: Secondary | ICD-10-CM | POA: Diagnosis not present

## 2023-04-10 DIAGNOSIS — Z9151 Personal history of suicidal behavior: Secondary | ICD-10-CM | POA: Diagnosis not present

## 2023-04-13 DIAGNOSIS — T1491XA Suicide attempt, initial encounter: Secondary | ICD-10-CM | POA: Diagnosis not present

## 2023-04-17 ENCOUNTER — Encounter: Payer: Self-pay | Admitting: Nurse Practitioner

## 2023-04-17 ENCOUNTER — Ambulatory Visit: Admitting: Nurse Practitioner

## 2023-04-17 VITALS — BP 123/82 | HR 93 | Temp 97.5°F | Ht 65.0 in | Wt 159.0 lb

## 2023-04-17 DIAGNOSIS — I10 Essential (primary) hypertension: Secondary | ICD-10-CM

## 2023-04-17 DIAGNOSIS — F3342 Major depressive disorder, recurrent, in full remission: Secondary | ICD-10-CM | POA: Diagnosis not present

## 2023-04-17 DIAGNOSIS — E785 Hyperlipidemia, unspecified: Secondary | ICD-10-CM | POA: Diagnosis not present

## 2023-04-17 DIAGNOSIS — K219 Gastro-esophageal reflux disease without esophagitis: Secondary | ICD-10-CM | POA: Diagnosis not present

## 2023-04-17 DIAGNOSIS — Z6830 Body mass index (BMI) 30.0-30.9, adult: Secondary | ICD-10-CM

## 2023-04-17 MED ORDER — LISINOPRIL 10 MG PO TABS: 10.0000 mg | ORAL_TABLET | Freq: Every day | ORAL | 1 refills | Status: DC

## 2023-04-17 MED ORDER — ALPRAZOLAM 0.5 MG PO TABS: 0.5000 mg | ORAL_TABLET | Freq: Three times a day (TID) | ORAL | 5 refills | Status: DC

## 2023-04-17 NOTE — Patient Instructions (Signed)
 Exercising to Stay Healthy To become healthy and stay healthy, it is recommended that you do moderate-intensity and vigorous-intensity exercise. You can tell that you are exercising at a moderate intensity if your heart starts beating faster and you start breathing faster but can still hold a conversation. You can tell that you are exercising at a vigorous intensity if you are breathing much harder and faster and cannot hold a conversation while exercising. How can exercise benefit me? Exercising regularly is important. It has many health benefits, such as: Improving overall fitness, flexibility, and endurance. Increasing bone density. Helping with weight control. Decreasing body fat. Increasing muscle strength and endurance. Reducing stress and tension, anxiety, depression, or anger. Improving overall health. What guidelines should I follow while exercising? Before you start a new exercise program, talk with your health care provider. Do not exercise so much that you hurt yourself, feel dizzy, or get very short of breath. Wear comfortable clothes and wear shoes with good support. Drink plenty of water while you exercise to prevent dehydration or heat stroke. Work out until your breathing and your heartbeat get faster (moderate intensity). How often should I exercise? Choose an activity that you enjoy, and set realistic goals. Your health care provider can help you make an activity plan that is individually designed and works best for you. Exercise regularly as told by your health care provider. This may include: Doing strength training two times a week, such as: Lifting weights. Using resistance bands. Push-ups. Sit-ups. Yoga. Doing a certain intensity of exercise for a given amount of time. Choose from these options: A total of 150 minutes of moderate-intensity exercise every week. A total of 75 minutes of vigorous-intensity exercise every week. A mix of moderate-intensity and  vigorous-intensity exercise every week. Children, pregnant women, people who have not exercised regularly, people who are overweight, and older adults may need to talk with a health care provider about what activities are safe to perform. If you have a medical condition, be sure to talk with your health care provider before you start a new exercise program. What are some exercise ideas? Moderate-intensity exercise ideas include: Walking 1 mile (1.6 km) in about 15 minutes. Biking. Hiking. Golfing. Dancing. Water aerobics. Vigorous-intensity exercise ideas include: Walking 4.5 miles (7.2 km) or more in about 1 hour. Jogging or running 5 miles (8 km) in about 1 hour. Biking 10 miles (16.1 km) or more in about 1 hour. Lap swimming. Roller-skating or in-line skating. Cross-country skiing. Vigorous competitive sports, such as football, basketball, and soccer. Jumping rope. Aerobic dancing. What are some everyday activities that can help me get exercise? Yard work, such as: Child psychotherapist. Raking and bagging leaves. Washing your car. Pushing a stroller. Shoveling snow. Gardening. Washing windows or floors. How can I be more active in my day-to-day activities? Use stairs instead of an elevator. Take a walk during your lunch break. If you drive, park your car farther away from your work or school. If you take public transportation, get off one stop early and walk the rest of the way. Stand up or walk around during all of your indoor phone calls. Get up, stretch, and walk around every 30 minutes throughout the day. Enjoy exercise with a friend. Support to continue exercising will help you keep a regular routine of activity. Where to find more information You can find more information about exercising to stay healthy from: U.S. Department of Health and Human Services: ThisPath.fi Centers for Disease Control and Prevention (  CDC): FootballExhibition.com.br Summary Exercising regularly is  important. It will improve your overall fitness, flexibility, and endurance. Regular exercise will also improve your overall health. It can help you control your weight, reduce stress, and improve your bone density. Do not exercise so much that you hurt yourself, feel dizzy, or get very short of breath. Before you start a new exercise program, talk with your health care provider. This information is not intended to replace advice given to you by your health care provider. Make sure you discuss any questions you have with your health care provider. Document Revised: 04/30/2020 Document Reviewed: 04/30/2020 Elsevier Patient Education  2024 [employer].

## 2023-04-17 NOTE — Progress Notes (Signed)
 Subjective:    Patient ID: Gabriela Mccormick, female    DOB: [birth year], 58 y.o.   MRN: [id number]   Chief Complaint: medical management of chronic issues     HPI:  Gabriela Mccormick is a 58 y.o. who identifies as a female who was assigned female at birth.   Social history: Lives with: husband and son Work history: insurance   Comes in today for follow up of the following chronic medical issues:  1. Primary hypertension No c/o chest pain, sob or headache. Does not check blood pressure at home. BP Readings from Last 3 Encounters:  01/30/23 126/82  09/26/22 135/85  03/14/22 118/79     2. Hyperlipidemia with target LDL less than 100 Does try to watch diet but does no dedicated exercise. Lab Results  Component Value Date   CHOL 228 (H) 09/26/2022   HDL 76 09/26/2022   LDLCALC 117 (H) 09/26/2022   TRIG 207 (H) 09/26/2022   CHOLHDL 3.0 09/26/2022   The 10-year ASCVD risk score (Arnett DK, et al., 2019) is: 3.1%   3. Gastroesophageal reflux disease without esophagitis Uses OTC meds as needed  4. Recurrent major depressive disorder, in full remission (HCC) Is on zoloft and is doing well. Patient is under a lot of stress due to some personal issues with her husband.      01/30/2023    3:05 PM 09/26/2022   12:21 PM 03/14/2022    3:54 PM  Depression screen PHQ 2/9  Decreased Interest 0 0 0  Down, Depressed, Hopeless 0 1 0  PHQ - 2 Score 0 1 0  Altered sleeping 0 0 0  Tired, decreased energy 1 0 0  Change in appetite 0 1 0  Feeling bad or failure about yourself  0 0 0  Trouble concentrating 0 0 0  Moving slowly or fidgety/restless 0 0 0  Suicidal thoughts 0 0 0  PHQ-9 Score 1 2 0  Difficult doing work/chores Not difficult at all Not difficult at all Not difficult at all       5. BMI 30.0-30.9,adult Weight is down 4lbs  Wt Readings from Last 3 Encounters:  04/17/23 159 lb (72.1 kg)  01/30/23 163 lb (73.9 kg)  09/26/22 170 lb (77.1 kg)   BMI Readings from  Last 3 Encounters:  04/17/23 26.46 kg/m  01/30/23 27.12 kg/m  09/26/22 28.29 kg/m        New complaints: None today  Allergies  Allergen Reactions   Bee Venom Anaphylaxis and Swelling   Enoxaparin Hives   Outpatient Encounter Medications as of 04/17/2023  Medication Sig   ALPRAZolam (XANAX) 0.5 MG tablet Take 1 tablet (0.5 mg total) by mouth 3 (three) times daily.   EPINEPHRINE 0.3 mg/0.3 mL IJ SOAJ injection INJECT 0.3 MLS (0.3 MG TOTAL) INTO THE MUSCLE ONCE. (Patient taking differently: Inject 0.3 mg into the muscle as needed for anaphylaxis.)   lisinopril (ZESTRIL) 10 MG tablet Take 1 tablet (10 mg total) by mouth daily.   Phentermine-Topiramate (QSYMIA) 3.75-23 MG CP24 Take 1 capsule by mouth daily.   Semaglutide-Weight Management (WEGOVY) 2.4 MG/0.75ML SOAJ Inject 2.4 mg into the skin once a week.   sertraline (ZOLOFT) 100 MG tablet 1  po qd   No facility-administered encounter medications on file as of 04/17/2023.    Past Surgical History:  Procedure Laterality Date   ABDOMINAL HYSTERECTOMY     ABDOMINAL SURGERY  05/27/2018   pt states she remembers opening her eyes, seeing overhead OR lights, staff  standing over her & speaking to her   ANTERIOR CERVICAL DECOMPRESSION/DISCECTOMY FUSION 4 LEVELS N/A 12/05/2021   Procedure: ANTERIOR CERVICAL DECOMPRESSION/DISCECTOMY FUSION 4 LEVELS C3-7;  Surgeon: Venita Lick, MD;  Location: [facility] OR;  Service: Orthopedics;  Laterality: N/A;  3.5 hrs 3 C-Bed   APPENDECTOMY     HERNIA REPAIR      No family history on file.    Controlled substance contract: 09/26/22 drug screen today     Review of Systems  Constitutional:  Negative for diaphoresis.  Eyes:  Negative for pain.  Respiratory:  Negative for shortness of breath.   Cardiovascular:  Negative for chest pain, palpitations and leg swelling.  Gastrointestinal:  Negative for abdominal pain.  Endocrine: Negative for polydipsia.  Skin:  Negative for rash.  Neurological:   Negative for dizziness, weakness and headaches.  Hematological:  Does not bruise/bleed easily.  All other systems reviewed and are negative.      Objective:   Physical Exam Vitals and nursing note reviewed.  Constitutional:      General: She is not in acute distress.    Appearance: Normal appearance. She is well-developed.  HENT:     Head: Normocephalic.     Right Ear: Tympanic membrane normal.     Left Ear: Tympanic membrane normal.     Nose: Nose normal.     Mouth/Throat:     Mouth: Mucous membranes are moist.  Eyes:     Pupils: Pupils are equal, round, and reactive to light.  Neck:     Vascular: No carotid bruit or JVD.  Cardiovascular:     Rate and Rhythm: Normal rate and regular rhythm.     Heart sounds: Normal heart sounds.  Pulmonary:     Effort: Pulmonary effort is normal. No respiratory distress.     Breath sounds: Normal breath sounds. No wheezing or rales.  Chest:     Chest wall: No tenderness.  Abdominal:     General: Bowel sounds are normal. There is no distension or abdominal bruit.     Palpations: Abdomen is soft. There is no hepatomegaly, splenomegaly, mass or pulsatile mass.     Tenderness: There is no abdominal tenderness.  Musculoskeletal:        General: Normal range of motion.     Cervical back: Normal range of motion and neck supple.  Lymphadenopathy:     Cervical: No cervical adenopathy.  Skin:    General: Skin is warm and dry.  Neurological:     Mental Status: She is alert and oriented to person, place, and time.     Deep Tendon Reflexes: Reflexes are normal and symmetric.  Psychiatric:        Behavior: Behavior normal.        Thought Content: Thought content normal.        Judgment: Judgment normal.    BP 123/82   Pulse 93   Temp (!) 97.5 F (36.4 C) (Temporal)   Ht 5\' 5"  (1.651 m)   Wt 159 lb (72.1 kg)   SpO2 99%   BMI 26.46 kg/m          Assessment & Plan:   Gabriela Mccormick comes in today with chief complaint of medical  management of chronic issues    Diagnosis and orders addressed:  1. Primary hypertension Low sodium diet - CBC with Differential/Platelet - CMP14+EGFR  2. Hyperlipidemia with target LDL less than 100 Low fat diet - Lipid panel  3. Gastroesophageal reflux disease without esophagitis Avoid  spicy foods Do not eat 2 hours prior to bedtime   4. Recurrent major depressive disorder, in full remission (HCC) Stress management - ToxASSURE Select 13 (MW), Urine  5. BMI 30.0-30.9,adult Discussed diet and exercise for person with BMI >25 Will recheck weight in 3-6 months   Labs pending Health Maintenance reviewed Diet and exercise encouraged  Follow up plan: 6 months   Mary-Margaret Daphine Deutscher, FNP

## 2023-04-17 NOTE — Addendum Note (Signed)
 Addended by: Cleda Daub on: 04/17/2023 03:13 PM   Modules accepted: Orders

## 2023-04-18 ENCOUNTER — Other Ambulatory Visit: Payer: Self-pay | Admitting: Nurse Practitioner

## 2023-04-18 DIAGNOSIS — F3342 Major depressive disorder, recurrent, in full remission: Secondary | ICD-10-CM

## 2023-05-08 ENCOUNTER — Other Ambulatory Visit: Payer: Self-pay | Admitting: Nurse Practitioner

## 2023-05-08 DIAGNOSIS — Z7689 Persons encountering health services in other specified circumstances: Secondary | ICD-10-CM

## 2023-05-08 MED ORDER — WEGOVY 2.4 MG/0.75ML ~~LOC~~ SOAJ: 2.4000 mg | SUBCUTANEOUS | 5 refills | Status: DC

## 2023-05-11 ENCOUNTER — Other Ambulatory Visit: Payer: Self-pay | Admitting: Nurse Practitioner

## 2023-05-11 DIAGNOSIS — Z7689 Persons encountering health services in other specified circumstances: Secondary | ICD-10-CM

## 2023-05-11 MED ORDER — WEGOVY 2.4 MG/0.75ML ~~LOC~~ SOAJ: 2.4000 mg | SUBCUTANEOUS | 5 refills | Status: DC

## 2023-06-29 ENCOUNTER — Telehealth: Payer: Self-pay | Admitting: Nurse Practitioner

## 2023-06-29 DIAGNOSIS — Z6828 Body mass index (BMI) 28.0-28.9, adult: Secondary | ICD-10-CM

## 2023-06-29 MED ORDER — PHENTERMINE-TOPIRAMATE ER 3.75-23 MG PO CP24
1.0000 | ORAL_CAPSULE | Freq: Every day | ORAL | 1 refills | Status: DC
Start: 2023-06-29 — End: 2023-10-15

## 2023-06-29 NOTE — Addendum Note (Signed)
 Addended by: Monserath Neff, MARY-MARGARET on: 06/29/2023 03:11 PM   Modules accepted: Orders

## 2023-06-29 NOTE — Telephone Encounter (Signed)
Not on patient med list

## 2023-06-29 NOTE — Telephone Encounter (Signed)
 Copied from CRM [id number]. Topic: Clinical - Medication Refill >> Jun 29, 2023  9:54 AM Tiffany H wrote: Medication:  Qsymia  3.75-23MG  er capsules   Patient advised that she is no longer taking Wegovy . Patient advised that she stopped taking Wegovy  due to side effects making her sick. Patient took her last dosage 3 weeks ago.   Has the patient contacted their pharmacy? Yes (Agent: If no, request that the patient contact the pharmacy for the refill. If patient does not wish to contact the pharmacy document the reason why and proceed with request.) (Agent: If yes, when and what did the pharmacy advise?)  This is the patient's preferred pharmacy:  [facility] [phone] - [place], [place] - [address] [place] [address] Phone: [phone] Fax: [phone]   Is this the correct pharmacy for this prescription? Yes If no, delete pharmacy and type the correct one.   Has the prescription been filled recently? No  Is the patient out of the medication? Yes  Has the patient been seen for an appointment in the last year OR does the patient have an upcoming appointment? Yes  Can we respond through MyChart? No  Agent: Please be advised that Rx refills may take up to 3 business days. We ask that you follow-up with your pharmacy.

## 2023-06-29 NOTE — Telephone Encounter (Signed)
 Please advise on refill looks like Qsymia  was taken off med list at last OV on 04/17/23, pt states she is no longer taking Wegovy  as of 3 wks ago d/t the side effects

## 2023-07-16 ENCOUNTER — Ambulatory Visit: Admitting: Nurse Practitioner

## 2023-07-28 DIAGNOSIS — F419 Anxiety disorder, unspecified: Secondary | ICD-10-CM | POA: Diagnosis not present

## 2023-07-28 DIAGNOSIS — F10239 Alcohol dependence with withdrawal, unspecified: Secondary | ICD-10-CM | POA: Diagnosis not present

## 2023-07-28 DIAGNOSIS — F10139 Alcohol abuse with withdrawal, unspecified: Secondary | ICD-10-CM | POA: Diagnosis not present

## 2023-07-28 DIAGNOSIS — Z9151 Personal history of suicidal behavior: Secondary | ICD-10-CM | POA: Diagnosis not present

## 2023-07-28 DIAGNOSIS — Y908 Blood alcohol level of 240 mg/100 ml or more: Secondary | ICD-10-CM | POA: Diagnosis not present

## 2023-07-28 DIAGNOSIS — Z560 Unemployment, unspecified: Secondary | ICD-10-CM | POA: Diagnosis not present

## 2023-07-28 DIAGNOSIS — R45851 Suicidal ideations: Secondary | ICD-10-CM | POA: Diagnosis not present

## 2023-07-28 DIAGNOSIS — F332 Major depressive disorder, recurrent severe without psychotic features: Secondary | ICD-10-CM | POA: Diagnosis not present

## 2023-07-28 DIAGNOSIS — R918 Other nonspecific abnormal finding of lung field: Secondary | ICD-10-CM | POA: Diagnosis not present

## 2023-07-28 DIAGNOSIS — I10 Essential (primary) hypertension: Secondary | ICD-10-CM | POA: Diagnosis not present

## 2023-07-28 DIAGNOSIS — Z630 Problems in relationship with spouse or partner: Secondary | ICD-10-CM | POA: Diagnosis not present

## 2023-07-30 DIAGNOSIS — R45851 Suicidal ideations: Secondary | ICD-10-CM | POA: Diagnosis not present

## 2023-07-31 DIAGNOSIS — F332 Major depressive disorder, recurrent severe without psychotic features: Secondary | ICD-10-CM | POA: Diagnosis not present

## 2023-07-31 DIAGNOSIS — F10239 Alcohol dependence with withdrawal, unspecified: Secondary | ICD-10-CM | POA: Diagnosis not present

## 2023-07-31 DIAGNOSIS — F10139 Alcohol abuse with withdrawal, unspecified: Secondary | ICD-10-CM | POA: Diagnosis not present

## 2023-07-31 DIAGNOSIS — R45851 Suicidal ideations: Secondary | ICD-10-CM | POA: Diagnosis not present

## 2023-07-31 DIAGNOSIS — I10 Essential (primary) hypertension: Secondary | ICD-10-CM | POA: Diagnosis not present

## 2023-07-31 DIAGNOSIS — Z560 Unemployment, unspecified: Secondary | ICD-10-CM | POA: Diagnosis not present

## 2023-07-31 DIAGNOSIS — Z79899 Other long term (current) drug therapy: Secondary | ICD-10-CM | POA: Diagnosis not present

## 2023-09-18 ENCOUNTER — Other Ambulatory Visit: Payer: Self-pay | Admitting: Nurse Practitioner

## 2023-09-18 MED ORDER — WEGOVY 0.25 MG/0.5ML ~~LOC~~ SOAJ
0.2500 mg | SUBCUTANEOUS | 1 refills | Status: DC
Start: 2023-09-18 — End: 2023-10-15

## 2023-09-20 ENCOUNTER — Telehealth: Payer: Self-pay

## 2023-09-20 NOTE — Telephone Encounter (Signed)
 Called and spoke with [facility] and clarified dosage

## 2023-09-20 NOTE — Telephone Encounter (Signed)
 Did you decrease dose?

## 2023-09-20 NOTE — Telephone Encounter (Signed)
 Copied from CRM [id number]. Topic: Clinical - Prescription Issue >> Sep 20, 2023 11:50 AM Wess RAMAN wrote: Reason for CRM: Gabriela Mccormick from [facility] stated there is a decrease in the dosage for semaglutide -weight management (WEGOVY ) 0.25 MG/0.5ML SOAJ SQ injection. They would like to know if that was intentionally done  Callback #: [phone] Fax #: [phone]

## 2023-10-07 ENCOUNTER — Other Ambulatory Visit: Payer: Self-pay | Admitting: Nurse Practitioner

## 2023-10-07 DIAGNOSIS — F3342 Major depressive disorder, recurrent, in full remission: Secondary | ICD-10-CM

## 2023-10-15 ENCOUNTER — Ambulatory Visit: Admitting: Nurse Practitioner

## 2023-10-15 ENCOUNTER — Encounter: Payer: Self-pay | Admitting: Nurse Practitioner

## 2023-10-15 VITALS — BP 137/83 | HR 95 | Temp 97.4°F | Ht 65.0 in | Wt 167.0 lb

## 2023-10-15 DIAGNOSIS — I10 Essential (primary) hypertension: Secondary | ICD-10-CM

## 2023-10-15 DIAGNOSIS — F3342 Major depressive disorder, recurrent, in full remission: Secondary | ICD-10-CM

## 2023-10-15 DIAGNOSIS — Z79891 Long term (current) use of opiate analgesic: Secondary | ICD-10-CM | POA: Diagnosis not present

## 2023-10-15 DIAGNOSIS — E785 Hyperlipidemia, unspecified: Secondary | ICD-10-CM | POA: Diagnosis not present

## 2023-10-15 DIAGNOSIS — Z6830 Body mass index (BMI) 30.0-30.9, adult: Secondary | ICD-10-CM

## 2023-10-15 DIAGNOSIS — K219 Gastro-esophageal reflux disease without esophagitis: Secondary | ICD-10-CM | POA: Diagnosis not present

## 2023-10-15 LAB — LIPID PANEL

## 2023-10-15 MED ORDER — LISINOPRIL 10 MG PO TABS
10.0000 mg | ORAL_TABLET | Freq: Every day | ORAL | 1 refills | Status: AC
Start: 2023-10-15 — End: ?

## 2023-10-15 MED ORDER — ALPRAZOLAM 0.5 MG PO TABS: 0.5000 mg | ORAL_TABLET | Freq: Three times a day (TID) | ORAL | 5 refills | Status: AC

## 2023-10-15 MED ORDER — WEGOVY 0.5 MG/0.5ML ~~LOC~~ SOAJ: 0.5000 mg | SUBCUTANEOUS | 2 refills | Status: DC

## 2023-10-15 MED ORDER — SERTRALINE HCL 100 MG PO TABS: 100.0000 mg | ORAL_TABLET | Freq: Every day | ORAL | 1 refills | Status: AC

## 2023-10-15 NOTE — Progress Notes (Signed)
 Subjective:    Patient ID: Gabriela Mccormick, female    DOB: [birth year], 58 y.o.   MRN: [id number]   Chief Complaint: Medical Management of Chronic Issues    HPI:  Gabriela Mccormick is a 58 y.o. who identifies as a female who was assigned female at birth.   Social history: Lives with: husband and son Work history: insurance   Comes in today for follow up of the following chronic medical issues:  1. Primary hypertension No c/o chest pain, sob or headache. Does not check blood pressure at home. BP Readings from Last 3 Encounters:  10/15/23 137/83  04/17/23 123/82  01/30/23 126/82     2. Hyperlipidemia with target LDL less than 100 Does try to watch diet but does no dedicated exercise. Lab Results  Component Value Date   CHOL 228 (H) 09/26/2022   HDL 76 09/26/2022   LDLCALC 117 (H) 09/26/2022   TRIG 207 (H) 09/26/2022   CHOLHDL 3.0 09/26/2022   The 10-year ASCVD risk score (Arnett DK, et al., 2019) is: 3.6%   3. Gastroesophageal reflux disease without esophagitis Uses OTC meds as needed  4. Recurrent major depressive disorder, in full remission (HCC) Is on zoloft  and is doing well.     10/15/2023    3:26 PM 04/17/2023    2:28 PM 01/30/2023    3:05 PM  Depression screen PHQ 2/9  Decreased Interest 0 1 0  Down, Depressed, Hopeless 0 1 0  PHQ - 2 Score 0 2 0  Altered sleeping 0 0 0  Tired, decreased energy 0 0 1  Change in appetite 0 0 0  Feeling bad or failure about yourself  0 1 0  Trouble concentrating 0 0 0  Moving slowly or fidgety/restless 0 0 0  Suicidal thoughts 0 0 0  PHQ-9 Score 0 3 1  Difficult doing work/chores Not difficult at all Not difficult at all Not difficult at all      10/15/2023    3:26 PM 04/17/2023    2:29 PM 01/30/2023    3:06 PM 09/26/2022   12:22 PM  GAD 7 : Generalized Anxiety Score  Nervous, Anxious, on Edge 0 1 0 0  Control/stop worrying 0 1 0 0  Worry too much - different things 0 1 0 0  Trouble relaxing 0 1 0 0  Restless 0  0 0 0  Easily annoyed or irritable 0 0 0 0  Afraid - awful might happen 0 0 0 0  Total GAD 7 Score 0 4 0 0  Anxiety Difficulty Not difficult at all Somewhat difficult Not difficult at all Not difficult at all        5. BMI 30.0-30.9,adult Weight is up 8lbs Wt Readings from Last 3 Encounters:  10/15/23 167 lb (75.8 kg)  04/17/23 159 lb (72.1 kg)  01/30/23 163 lb (73.9 kg)   BMI Readings from Last 3 Encounters:  10/15/23 27.79 kg/m  04/17/23 26.46 kg/m  01/30/23 27.12 kg/m       New complaints: None today  Allergies  Allergen Reactions   Bee Venom Anaphylaxis and Swelling   Enoxaparin Hives   Outpatient Encounter Medications as of 10/15/2023  Medication Sig   ALPRAZolam  (XANAX ) 0.5 MG tablet Take 1 tablet (0.5 mg total) by mouth 3 (three) times daily.   EPINEPHRINE  0.3 mg/0.3 mL IJ SOAJ injection INJECT 0.3 MLS (0.3 MG TOTAL) INTO THE MUSCLE ONCE. (Patient taking differently: Inject 0.3 mg into the muscle as needed for anaphylaxis.)  lisinopril  (ZESTRIL ) 10 MG tablet Take 1 tablet (10 mg total) by mouth daily.   semaglutide -weight management (WEGOVY ) 0.25 MG/0.5ML SOAJ SQ injection Inject 0.25 mg into the skin once a week.   sertraline  (ZOLOFT ) 100 MG tablet TAKE 1 TABLET BY MOUTH EVERY DAY   [DISCONTINUED] Phentermine -Topiramate  (QSYMIA ) 3.75-23 MG CP24 Take 1 capsule by mouth daily.   No facility-administered encounter medications on file as of 10/15/2023.    Past Surgical History:  Procedure Laterality Date   ABDOMINAL HYSTERECTOMY     ABDOMINAL SURGERY  05/27/2018   pt states she remembers opening her eyes, seeing overhead OR lights, staff standing over her & speaking to her   ANTERIOR CERVICAL DECOMPRESSION/DISCECTOMY FUSION 4 LEVELS N/A 12/05/2021   Procedure: ANTERIOR CERVICAL DECOMPRESSION/DISCECTOMY FUSION 4 LEVELS C3-7;  Surgeon: Burnetta Aures, MD;  Location: [facility] OR;  Service: Orthopedics;  Laterality: N/A;  3.5 hrs 3 C-Bed   APPENDECTOMY     HERNIA  REPAIR      No family history on file.    Controlled substance contract: 09/26/22 drug screen today     Review of Systems  Constitutional:  Negative for diaphoresis.  Eyes:  Negative for pain.  Respiratory:  Negative for shortness of breath.   Cardiovascular:  Negative for chest pain, palpitations and leg swelling.  Gastrointestinal:  Negative for abdominal pain.  Endocrine: Negative for polydipsia.  Skin:  Negative for rash.  Neurological:  Negative for dizziness, weakness and headaches.  Hematological:  Does not bruise/bleed easily.  All other systems reviewed and are negative.      Objective:   Physical Exam Vitals and nursing note reviewed.  Constitutional:      General: She is not in acute distress.    Appearance: Normal appearance. She is well-developed.  HENT:     Head: Normocephalic.     Right Ear: Tympanic membrane normal.     Left Ear: Tympanic membrane normal.     Nose: Nose normal.     Mouth/Throat:     Mouth: Mucous membranes are moist.  Eyes:     Pupils: Pupils are equal, round, and reactive to light.  Neck:     Vascular: No carotid bruit or JVD.  Cardiovascular:     Rate and Rhythm: Normal rate and regular rhythm.     Heart sounds: Normal heart sounds.  Pulmonary:     Effort: Pulmonary effort is normal. No respiratory distress.     Breath sounds: Normal breath sounds. No wheezing or rales.  Chest:     Chest wall: No tenderness.  Abdominal:     General: Bowel sounds are normal. There is no distension or abdominal bruit.     Palpations: Abdomen is soft. There is no hepatomegaly, splenomegaly, mass or pulsatile mass.     Tenderness: There is no abdominal tenderness.  Musculoskeletal:        General: Normal range of motion.     Cervical back: Normal range of motion and neck supple.  Lymphadenopathy:     Cervical: No cervical adenopathy.  Skin:    General: Skin is warm and dry.  Neurological:     Mental Status: She is alert and oriented to  person, place, and time.     Deep Tendon Reflexes: Reflexes are normal and symmetric.  Psychiatric:        Behavior: Behavior normal.        Thought Content: Thought content normal.        Judgment: Judgment normal.    BP  137/83   Pulse 95   Temp (!) 97.4 F (36.3 C) (Temporal)   Ht 5' 5 (1.651 m)   Wt 167 lb (75.8 kg)   SpO2 97%   BMI 27.79 kg/m         Assessment & Plan:   Oveta Idris comes in today with chief complaint of Medical Management of Chronic Issues   Diagnosis and orders addressed:  1. Primary hypertension Low sodium diet - CBC with Differential/Platelet - CMP14+EGFR  2. Hyperlipidemia with target LDL less than 100 Low fat diet - Lipid panel  3. Gastroesophageal reflux disease without esophagitis Avoid spicy foods Do not eat 2 hours prior to bedtime   4. Recurrent major depressive disorder, in full remission (HCC) Stress management - ToxASSURE Select 13 (MW), Urine  5. BMI 30.0-30.9,adult Discussed diet and exercise for person with BMI >25 Will recheck weight in 3-6 months  - Semaglutide -Weight Management (WEGOVY ) 0.5 MG/0.5ML SOAJ; Inject 0.5 mg into the skin once a week.  Dispense: 2 mL; Refill: 2   Labs pending Health Maintenance reviewed Diet and exercise encouraged  Follow up plan: 6 months   Mary-Margaret Gladis, FNP

## 2023-10-16 ENCOUNTER — Ambulatory Visit: Payer: Self-pay | Admitting: Nurse Practitioner

## 2023-10-16 LAB — CBC WITH DIFFERENTIAL/PLATELET
Basophils Absolute: 0 x10E3/uL (ref 0.0–0.2)
Basos: 0 %
EOS (ABSOLUTE): 0.1 x10E3/uL (ref 0.0–0.4)
Eos: 1 %
Hematocrit: 39.8 % (ref 34.0–46.6)
Hemoglobin: 13.6 g/dL (ref 11.1–15.9)
Immature Grans (Abs): 0 x10E3/uL (ref 0.0–0.1)
Immature Granulocytes: 0 %
Lymphocytes Absolute: 3.4 x10E3/uL — ABNORMAL HIGH (ref 0.7–3.1)
Lymphs: 37 %
MCH: 32.9 pg (ref 26.6–33.0)
MCHC: 34.2 g/dL (ref 31.5–35.7)
MCV: 96 fL (ref 79–97)
Monocytes Absolute: 0.7 x10E3/uL (ref 0.1–0.9)
Monocytes: 7 %
Neutrophils Absolute: 5.1 x10E3/uL (ref 1.4–7.0)
Neutrophils: 55 %
Platelets: 214 x10E3/uL (ref 150–450)
RBC: 4.13 x10E6/uL (ref 3.77–5.28)
RDW: 11.8 % (ref 11.7–15.4)
WBC: 9.3 x10E3/uL (ref 3.4–10.8)

## 2023-10-16 LAB — LIPID PANEL
Cholesterol, Total: 272 mg/dL — AB (ref 100–199)
HDL: 79 mg/dL (ref >39)
LDL CALC COMMENT:: 3.4 ratio (ref 0.0–4.4)
LDL Chol Calc (NIH): 138 mg/dL — AB (ref 0–99)
Triglycerides: 311 mg/dL — AB (ref 0–149)
VLDL Cholesterol Cal: 55 mg/dL — AB (ref 5–40)

## 2023-10-16 LAB — CMP14+EGFR
ALT: 28 [IU]/L (ref 0–32)
AST: 38 [IU]/L (ref 0–40)
Albumin: 4.6 g/dL (ref 3.8–4.9)
Alkaline Phosphatase: 86 [IU]/L (ref 49–135)
BUN/Creatinine Ratio: 16 (ref 9–23)
BUN: 14 mg/dL (ref 6–24)
Bilirubin Total: 0.5 mg/dL (ref 0.0–1.2)
CO2: 22 mmol/L (ref 20–29)
Calcium: 9.6 mg/dL (ref 8.7–10.2)
Chloride: 102 mmol/L (ref 96–106)
Creatinine, Ser: 0.85 mg/dL (ref 0.57–1.00)
Globulin, Total: 2.8 g/dL (ref 1.5–4.5)
Glucose: 86 mg/dL (ref 70–99)
Potassium: 4.6 mmol/L (ref 3.5–5.2)
Sodium: 136 mmol/L (ref 134–144)
Total Protein: 7.4 g/dL (ref 6.0–8.5)
eGFR: 79 mL/min/{1.73_m2}

## 2023-10-19 LAB — TOXASSURE SELECT 13 (MW), URINE

## 2023-11-08 ENCOUNTER — Other Ambulatory Visit: Payer: Self-pay | Admitting: Nurse Practitioner

## 2023-11-08 MED ORDER — WEGOVY 1 MG/0.5ML ~~LOC~~ SOAJ: 1.0000 mg | SUBCUTANEOUS | 2 refills | Status: DC

## 2023-11-08 NOTE — Progress Notes (Signed)
 Wants to increase wegovy  does.  Meds ordered this encounter  Medications   semaglutide -weight management (WEGOVY ) 1 MG/0.5ML SOAJ SQ injection    Sig: Inject 1 mg into the skin once a week.    Dispense:  2 mL    Refill:  2    Supervising Provider:   MARYANNE CHEW A [[id number]]   Mary-Margaret Gladis, FNP

## 2023-12-04 ENCOUNTER — Other Ambulatory Visit: Payer: Self-pay | Admitting: Nurse Practitioner

## 2023-12-04 MED ORDER — ZEPBOUND 2.5 MG/0.5ML ~~LOC~~ SOAJ: 2.5000 mg | SUBCUTANEOUS | 2 refills | Status: DC

## 2023-12-07 ENCOUNTER — Other Ambulatory Visit: Payer: Self-pay | Admitting: Nurse Practitioner

## 2023-12-07 MED ORDER — ZEPBOUND 2.5 MG/0.5ML ~~LOC~~ SOAJ: 2.5000 mg | SUBCUTANEOUS | 2 refills | Status: DC

## 2023-12-17 ENCOUNTER — Other Ambulatory Visit: Payer: Self-pay | Admitting: Nurse Practitioner

## 2023-12-17 MED ORDER — TIRZEPATIDE-WEIGHT MANAGEMENT 2.5 MG/0.5ML ~~LOC~~ SOLN: 2.5000 mg | SUBCUTANEOUS | 2 refills | Status: DC

## 2024-01-14 ENCOUNTER — Other Ambulatory Visit: Payer: Self-pay

## 2024-01-14 ENCOUNTER — Other Ambulatory Visit: Payer: Self-pay | Admitting: Nurse Practitioner

## 2024-01-14 MED ORDER — TIRZEPATIDE-WEIGHT MANAGEMENT 5 MG/0.5ML ~~LOC~~ SOLN: 5.0000 mg | SUBCUTANEOUS | 1 refills | Status: AC

## 2024-01-14 MED ORDER — ZEPBOUND 5 MG/0.5ML ~~LOC~~ SOAJ: 5.0000 mg | SUBCUTANEOUS | 0 refills | Status: DC
